# Patient Record
Sex: Female | Born: 1995 | Race: White | Hispanic: No | Marital: Single | Smoking: Never smoker
Health system: Southern US, Community
[De-identification: ages and names within clinical notes are randomized; demographics above are authoritative.]

## PROBLEM LIST (undated history)

## (undated) DIAGNOSIS — M2550 Pain in unspecified joint: Secondary | ICD-10-CM

## (undated) DIAGNOSIS — M549 Dorsalgia, unspecified: Secondary | ICD-10-CM

## (undated) DIAGNOSIS — R7303 Prediabetes: Secondary | ICD-10-CM

## (undated) DIAGNOSIS — E119 Type 2 diabetes mellitus without complications: Secondary | ICD-10-CM

## (undated) DIAGNOSIS — E559 Vitamin D deficiency, unspecified: Secondary | ICD-10-CM

## (undated) DIAGNOSIS — E663 Overweight: Secondary | ICD-10-CM

## (undated) DIAGNOSIS — N912 Amenorrhea, unspecified: Secondary | ICD-10-CM

## (undated) HISTORY — DX: Pain in unspecified joint: M25.50

## (undated) HISTORY — DX: Prediabetes: R73.03

## (undated) HISTORY — PX: WISDOM TOOTH EXTRACTION: SHX21

## (undated) HISTORY — DX: Overweight: E66.3

## (undated) HISTORY — DX: Vitamin D deficiency, unspecified: E55.9

## (undated) HISTORY — DX: Dorsalgia, unspecified: M54.9

## (undated) HISTORY — DX: Type 2 diabetes mellitus without complications: E11.9

## (undated) HISTORY — DX: Amenorrhea, unspecified: N91.2

---

## 2015-09-17 ENCOUNTER — Encounter (HOSPITAL_COMMUNITY): Payer: Self-pay

## 2015-09-17 ENCOUNTER — Ambulatory Visit (HOSPITAL_COMMUNITY)
Admission: EM | Admit: 2015-09-17 | Discharge: 2015-09-17 | Disposition: A | Discharge status: Home / Self Care | Payer: BLUE CROSS/BLUE SHIELD | Attending: Emergency Medicine | Admitting: Emergency Medicine

## 2015-09-17 DIAGNOSIS — R55 Syncope and collapse: Secondary | ICD-10-CM | POA: Diagnosis not present

## 2015-09-17 LAB — POCT I-STAT, CHEM 8
BUN: 14 mg/dL (ref 6–20)
CALCIUM ION: 1.19 mmol/L (ref 1.12–1.23)
Chloride: 101 mmol/L (ref 101–111)
Creatinine, Ser: 0.7 mg/dL (ref 0.44–1.00)
GLUCOSE: 120 mg/dL — AB (ref 65–99)
HCT: 45 % (ref 36.0–46.0)
HEMOGLOBIN: 15.3 g/dL — AB (ref 12.0–15.0)
Potassium: 3.9 mmol/L (ref 3.5–5.1)
Sodium: 140 mmol/L (ref 135–145)
TCO2: 27 mmol/L (ref 0–100)

## 2015-09-17 NOTE — ED Notes (Signed)
Patient states she fainted in class in today and she hit her head on the wall and right shoulder, Patient states she was really hot and everything was shrinking and she knew she was gonna fall out, patient's menstrual cycle began today and she thinks it may connected. No acute distress

## 2015-09-17 NOTE — Discharge Instructions (Signed)
Your EKG and blood work are normal today. You likely had a vasovagal episode. These are not uncommon in young women. Make sure you are eating regular meals and drinking plenty of fluids. If you are going to be standing for prolonged periods, make sure you are walking around or marching in place every 5-10 minutes. It would be a good idea to stay with your parents this weekend, just in case. If this becomes a recurring problem, please to the emergency room.

## 2015-09-17 NOTE — ED Provider Notes (Signed)
CSN: [id number]     Arrival date & time 09/17/15  1931 History   First MD Initiated Contact with Patient 09/17/15 1952     Chief Complaint  Patient presents with  . Near Syncope   (Consider location/radiation/quality/duration/timing/severity/associated sxs/prior Treatment) HPI  She is a 20 year old woman here with her dad for evaluation of syncopal episode. This afternoon during an art class, she had a fainting spell. She states she was standing a few feet from a wall when she felt a little dizzy, some tightness in her chest, slightly sick to her stomach, and knew she was going to pass out. When she fell, her classmates report that she hit her head and shoulder against the wall. She was out for approximately 1 minute. No bowel or bladder incontinence. No seizure-like activity.  She reports having breakfast and lunch today. She has been drinking fluids. Her period did start today.  She currently denies any dizziness, chest pain, shortness of breath, or palpitations. No persistent nausea or vomiting. She does report some tenderness of the right cheekbone and right temple as well as her right shoulder. No change in her vision.  History reviewed. No pertinent past medical history. History reviewed. No pertinent past surgical history. No family history on file. Social History  Substance Use Topics  . Smoking status: Never Smoker   . Smokeless tobacco: Never Used  . Alcohol Use: No   OB History    No data available     Review of Systems As in history of present illness Allergies  Review of patient's allergies indicates no known allergies.  Home Medications   Prior to Admission medications   Not on File   Meds Ordered and Administered this Visit  Medications - No data to display  BP 114/67 mmHg  Pulse 86  Temp(Src) 98.3 F (36.8 C) (Oral)  SpO2 99%  LMP 09/17/2015 (Exact Date) No data found.   Physical Exam  Constitutional: She is oriented to person, place, and time. She  appears well-developed and well-nourished. No distress.  HENT:  Mouth/Throat: Oropharynx is clear and moist. No oropharyngeal exudate.  Tender along the right cheekbone. Less so the right temple.  Eyes: EOM are normal. Pupils are equal, round, and reactive to light.  Neck: Neck supple.  Cardiovascular: Normal rate, regular rhythm and normal heart sounds.   No murmur heard. Pulmonary/Chest: Effort normal and breath sounds normal. No respiratory distress. She has no wheezes. She has no rales.  Musculoskeletal:  Right shoulder: Full active range of motion. Slight tenderness at the lateral edge of the acromion.  Neurological: She is alert and oriented to person, place, and time. No cranial nerve deficit. She exhibits normal muscle tone.    ED Course  Procedures (including critical care time) ED ECG REPORT   Date: 09/17/2015  Rate: 80  Rhythm: normal sinus rhythm  QRS Axis: normal  Intervals: normal  ST/T Wave abnormalities: normal  Conduction Disutrbances:none  Narrative Interpretation: NSR, normal ekg  Old EKG Reviewed: none available  I have personally reviewed the EKG tracing and agree with the computerized printout as noted.  Labs Review Labs Reviewed  POCT I-STAT, CHEM 8 - Abnormal; Notable for the following:    Glucose, Bld 120 (*)    Hemoglobin 15.3 (*)    All other components within normal limits    Imaging Review No results found.   MDM   1. Vaso vagal episode    EKG an i-STAT are normal. She describes a vasovagal episode. Provided  reassurance. Discussed that if this becomes a recurring thing, she will need to be seen in the emergency room.    Charm Rings, MD 09/17/15 2052

## 2017-03-24 ENCOUNTER — Encounter: Payer: Self-pay | Admitting: Emergency Medicine

## 2017-03-24 ENCOUNTER — Emergency Department
Admission: EM | Admit: 2017-03-24 | Discharge: 2017-03-24 | Disposition: A | Discharge status: Home / Self Care | Payer: BLUE CROSS/BLUE SHIELD | Source: Home / Self Care | Attending: Family Medicine | Admitting: Family Medicine

## 2017-03-24 ENCOUNTER — Emergency Department (INDEPENDENT_AMBULATORY_CARE_PROVIDER_SITE_OTHER): Payer: BLUE CROSS/BLUE SHIELD

## 2017-03-24 DIAGNOSIS — M7521 Bicipital tendinitis, right shoulder: Secondary | ICD-10-CM

## 2017-03-24 DIAGNOSIS — M25511 Pain in right shoulder: Secondary | ICD-10-CM

## 2017-03-24 DIAGNOSIS — M7551 Bursitis of right shoulder: Secondary | ICD-10-CM

## 2017-03-24 DIAGNOSIS — M542 Cervicalgia: Secondary | ICD-10-CM | POA: Diagnosis not present

## 2017-03-24 MED ORDER — PREDNISONE 20 MG PO TABS: ORAL_TABLET | ORAL | 0 refills | Status: DC

## 2017-03-24 MED ORDER — HYDROCODONE-ACETAMINOPHEN 5-325 MG PO TABS: 1.0000 | ORAL_TABLET | Freq: Four times a day (QID) | ORAL | 0 refills | Status: DC | PRN

## 2017-03-24 NOTE — ED Triage Notes (Signed)
Patient states that she has been having right shoulder and rotator cuff pain for about 3-4 days.  No apparent injury, limited use of shoulder, decrease sleep.

## 2017-03-24 NOTE — Discharge Instructions (Signed)
Apply ice pack for 20 to 30 minutes, 3 to 4 times daily  Continue until pain and swelling decrease.  Begin exercises as tolerated.  After finishing prednisone, may begin Aleve, 2 tabs every 12 hours as needed.

## 2017-03-24 NOTE — ED Provider Notes (Signed)
[facility]    CSN: [id number] Arrival date & time: 03/24/17  1122     History   Chief Complaint Chief Complaint  Patient presents with  . Shoulder Pain    HPI Jamie Hughes is a 21 y.o. female.   Patient complains of 3 to 4 day history of pain in her right shoulder and neck, and is concerned that she may have a rotator cuff injury.  However, she denies recent injury or change in activities.  No distal paresthesias or loss of strength.   The history is provided by the patient.  Shoulder Pain  Location:  Shoulder Shoulder location:  R shoulder Injury: no   Pain details:    Quality:  Aching   Radiates to: neck.   Severity:  Moderate   Onset quality:  Gradual   Duration:  4 days   Timing:  Constant   Progression:  Worsening Prior injury to area:  No Relieved by:  Nothing Worsened by:  Movement Ineffective treatments:  Heat Associated symptoms: decreased range of motion, neck pain, stiffness and tingling   Associated symptoms: no back pain, no fatigue, no fever, no muscle weakness, no numbness and no swelling     History reviewed. No pertinent past medical history.  There are no active problems to display for this patient.   Past Surgical History:  Procedure Laterality Date  . WISDOM TOOTH EXTRACTION      OB History    No data available       Home Medications    Prior to Admission medications   Medication Sig Start Date End Date Taking? Authorizing Provider  naproxen sodium (ALEVE) 220 MG tablet Take 220 mg by mouth.   Yes [provider]  HYDROcodone-acetaminophen (NORCO/VICODIN) 5-325 MG tablet Take 1 tablet every 6 (six) hours as needed by mouth for moderate pain. 03/24/17   Lattie HawBeese, Bhumi Godbey A, MD  predniSONE (DELTASONE) 20 MG tablet Take one tab by mouth twice daily for 4 days, then one daily. Take with food. 03/24/17   Lattie HawBeese, Larance Ratledge A, MD    Family History Family History  Problem Relation Age of Onset  . Diabetes Father      Social History Social History   Tobacco Use  . Smoking status: Never Smoker  . Smokeless tobacco: Never Used  Substance Use Topics  . Alcohol use: No  . Drug use: No     Allergies   Patient has no known allergies.   Review of Systems Review of Systems  Constitutional: Negative for fatigue and fever.  Musculoskeletal: Positive for neck pain and stiffness. Negative for back pain.  All other systems reviewed and are negative.    Physical Exam Triage Vital Signs ED Triage Vitals [03/24/17 1159]  Enc Vitals Group     BP (!) 132/91     Pulse Rate 64     Resp      Temp 98.3 F (36.8 C)     Temp Source Oral     SpO2 99 %     Weight 233 lb (105.7 kg)     Height 5\' 5"  (1.651 m)     Head Circumference      Peak Flow      Pain Score 7     Pain Loc      Pain Edu?      Excl. in GC?    No data found.  Updated Vital Signs BP (!) 132/91 (BP Location: Right Arm)   Pulse 64  Temp 98.3 F (36.8 C) (Oral)   Ht 5\' 5"  (1.651 m)   Wt 233 lb (105.7 kg)   LMP 01/20/2017   SpO2 99%   BMI 38.77 kg/m   Visual Acuity Right Eye Distance:   Left Eye Distance:   Bilateral Distance:    Right Eye Near:   Left Eye Near:    Bilateral Near:     Physical Exam  Constitutional: She appears well-developed and well-nourished. No distress.  HENT:  Head: Normocephalic.  Right Ear: External ear normal.  Left Ear: External ear normal.  Nose: Nose normal.  Mouth/Throat: Oropharynx is clear and moist.  Eyes: Conjunctivae are normal. Pupils are equal, round, and reactive to light.  Neck: Normal range of motion. Muscular tenderness present.    There is tenderness to palpation over the right trapezius muscle  as noted on diagram.   Cardiovascular: Normal heart sounds.  Pulmonary/Chest: Breath sounds normal.  Musculoskeletal:       Right shoulder: She exhibits decreased range of motion and tenderness. She exhibits no bony tenderness, no swelling, no crepitus, normal pulse and  normal strength.       Arms: Tenderness over right subacromial bursa. There is distinct tenderness to palpation over the insertion of the long head of the right biceps tendon.   Apley's, empty can tests negative.  Patient has slow but relatively good range of motion of the right shoulder.  Yergason's test positive.  Neurological: She is alert.  Skin: Skin is warm and dry. No rash noted.  Nursing note and vitals reviewed.    UC Treatments / Results  Labs (all labs ordered are listed, but only abnormal results are displayed) Labs Reviewed - No data to display  EKG  EKG Interpretation None       Radiology Dg Cervical Spine Complete  Result Date: 03/24/2017 CLINICAL DATA:  Cervicalgia EXAM: CERVICAL SPINE - COMPLETE 4+ VIEW COMPARISON:  None. FINDINGS: Frontal, lateral, open-mouth odontoid, and bilateral oblique views were obtained. There is no fracture or spondylolisthesis. Prevertebral soft tissues and predental space regions are normal. The disc spaces appear normal. There is no appreciable exit foraminal narrowing on the oblique views. IMPRESSION: No fracture or spondylolisthesis.  No evident arthropathic change. Electronically Signed   By: Bretta Bang III M.D.   On: 03/24/2017 13:15   Dg Shoulder Right  Result Date: 03/24/2017 CLINICAL DATA:  Right shoulder pain for 4 days. EXAM: RIGHT SHOULDER - 2+ VIEW COMPARISON:  None. FINDINGS: No acute fracture. No dislocation.  Unremarkable soft tissues. IMPRESSION: No acute bony pathology. Electronically Signed   By: Jolaine Click M.D.   On: 03/24/2017 13:14    Procedures Procedures (including critical care time)  Medications Ordered in UC Medications - No data to display   Initial Impression / Assessment and Plan / UC Course  I have reviewed the triage vital signs and the nursing notes.  Pertinent labs & imaging results that were available during my care of the patient were reviewed by me and considered in my medical  decision making (see chart for details).    Begin prednisone burst/taper.  Rx for Lortab. Controlled Substance Prescriptions I have consulted the [place] Controlled Substances Registry for this patient, and feel the risk/benefit ratio today is favorable for proceeding with this prescription for a controlled substance.  Apply ice pack for 20 to 30 minutes, 3 to 4 times daily  Continue until pain and swelling decrease.  Begin exercises as tolerated.  After finishing prednisone, may begin  Aleve, 2 tabs every 12 hours as needed. Followup with Dr. Rodney Langtonhomas Thekkekandam or Dr. Clementeen GrahamEvan Corey ([facility]) for further evaluation.    Final Clinical Impressions(s) / UC Diagnoses   Final diagnoses:  Acute shoulder bursitis, right  Biceps tendonitis on right    ED Discharge Orders        Ordered    predniSONE (DELTASONE) 20 MG tablet     03/24/17 1331    HYDROcodone-acetaminophen (NORCO/VICODIN) 5-325 MG tablet  Every 6 hours PRN     03/24/17 1331          Lattie HawBeese, Clarabelle Oscarson A, MD 03/29/17 1843

## 2018-05-15 DIAGNOSIS — N912 Amenorrhea, unspecified: Secondary | ICD-10-CM

## 2018-05-15 HISTORY — DX: Amenorrhea, unspecified: N91.2

## 2018-05-16 ENCOUNTER — Encounter: Payer: Self-pay | Admitting: Obstetrics & Gynecology

## 2018-06-10 ENCOUNTER — Other Ambulatory Visit (HOSPITAL_COMMUNITY)
Admission: RE | Admit: 2018-06-10 | Discharge: 2018-06-10 | Disposition: A | Discharge status: Home / Self Care | Payer: BC Managed Care – PPO | Source: Ambulatory Visit | Attending: Obstetrics & Gynecology | Admitting: Obstetrics & Gynecology

## 2018-06-10 ENCOUNTER — Encounter: Payer: Self-pay | Admitting: Obstetrics & Gynecology

## 2018-06-10 ENCOUNTER — Ambulatory Visit: Payer: BC Managed Care – PPO | Admitting: Obstetrics & Gynecology

## 2018-06-10 ENCOUNTER — Other Ambulatory Visit: Payer: Self-pay

## 2018-06-10 VITALS — BP 120/74 | HR 96 | Resp 16 | Ht 65.0 in | Wt 222.6 lb

## 2018-06-10 DIAGNOSIS — Z01419 Encounter for gynecological examination (general) (routine) without abnormal findings: Secondary | ICD-10-CM

## 2018-06-10 DIAGNOSIS — Z23 Encounter for immunization: Secondary | ICD-10-CM | POA: Diagnosis not present

## 2018-06-10 DIAGNOSIS — Z124 Encounter for screening for malignant neoplasm of cervix: Secondary | ICD-10-CM | POA: Diagnosis not present

## 2018-06-10 DIAGNOSIS — N912 Amenorrhea, unspecified: Secondary | ICD-10-CM

## 2018-06-10 DIAGNOSIS — L680 Hirsutism: Secondary | ICD-10-CM

## 2018-06-10 LAB — POCT URINE PREGNANCY: PREG TEST UR: NEGATIVE

## 2018-06-10 MED ORDER — MEDROXYPROGESTERONE ACETATE 10 MG PO TABS: ORAL_TABLET | ORAL | 0 refills | Status: DC

## 2018-06-10 NOTE — Progress Notes (Signed)
23 y.o. G0P0000 Single White or Caucasian female here for new patient annual exam.  Has not had a gynecological exam in several years.  Was last seen at [facility].  [place]ives in [place] now and works at [employer].  Has not been SA in the past.      About 2017, she started having issues with irregular cycles.  This seemed very stress related.  She hasn't had a cycle in about a year.  Last cycle 09/24/17 and this was very dark.  It lasted about five days and was not heavy although her typical cycle is 7 day and heavy throughout the cycle.    She does have increased facial hair so much that she needs to shave her chin.    Patient's last menstrual period was 09/24/2017 (exact date).          Sexually active: No.  The current method of family planning is abstinence.    Exercising: Yes.    walking Smoker:  no  Health Maintenance: Pap:  Never Gardasil: has done one vaccination on 11/15/10.  I was able to review the state shot records that she had on her phone TDaP:  2012 Screening Labs: PCP   reports that she has never smoked. She has never used smokeless tobacco. She reports current alcohol use. She reports that she does not use drugs.  Past Medical History:  Diagnosis Date  . Amenorrhea     Past Surgical History:  Procedure Laterality Date  . WISDOM TOOTH EXTRACTION      No current outpatient medications on file.   No current facility-administered medications for this visit.     Family History  Problem Relation Age of Onset  . Diabetes Father   . Breast cancer Maternal Aunt   . Diabetes Paternal Grandfather     Review of Systems  All other systems reviewed and are negative.   Exam:   BP 120/74 (BP Location: Right Arm, Patient Position: Sitting, Cuff Size: Large)   Pulse 96   Resp 16   Ht 5\' 5"  (1.651 m)   Wt 222 lb 9.6 oz (101 kg)   LMP 09/24/2017 (Exact Date)   BMI 37.04 kg/m      Height: 5\' 5"  (165.1 cm)  Ht Readings from Last 3 Encounters:  06/10/18 5\' 5"  (1.651 m)   03/24/17 5\' 5"  (1.651 m)    General appearance: alert, cooperative and appears stated age Head: Normocephalic, without obvious abnormality, atraumatic Neck: no adenopathy, supple, symmetrical, trachea midline and thyroid normal to inspection and palpation Lungs: clear to auscultation bilaterally Breasts: normal appearance, no masses or tenderness Heart: regular rate and rhythm Abdomen: soft, non-tender; bowel sounds normal; no masses,  no organomegaly Extremities: extremities normal, atraumatic, no cyanosis or edema Skin: Skin color, texture, turgor normal. No rashes or lesions Lymph nodes: Cervical, supraclavicular, and axillary nodes normal. No abnormal inguinal nodes palpated Neurologic: Grossly normal Ferriman-Gallwey Score:  15   Pelvic: External genitalia:  no lesions              Urethra:  normal appearing urethra with no masses, tenderness or lesions              Bartholins and Skenes: normal                 Vagina: normal appearing vagina with normal color and discharge, no lesions              Cervix: no lesions  Pap taken: Yes.   Bimanual Exam:  Uterus:  normal size, contour, position, consistency, mobility, non-tender              Adnexa: normal adnexa and no mass, fullness, tenderness               Rectovaginal: Confirms               Anus:  normal sphincter tone, no lesions  Chaperone was present for exam.  A:  Well Woman with normal exam Lapse of gyn care Amenorrhea Hirsutism Probable PCOS  P:   Mammogram guidelines reviewed.  Should start around age 50. pap smear obtained today Will plan to complete Gardisil vaccination.  #2 will be given today.   Provera 10mg  x 10 days given to start cycle. FSH, prolactin, TSH (neg UPT today) Total testosterone also ordered May need additional androgen testing and PUS  return annually or prn  In additional to routine GYN exam, additional time spent with pt on issues of amenorrhea and hirsutism including  possible causes, evaluation and treatment options.

## 2018-06-11 LAB — CYTOLOGY - PAP: DIAGNOSIS: NEGATIVE

## 2018-06-13 LAB — TESTOSTERONE, TOTAL, LC/MS/MS: Testosterone, total: 38.7 ng/dL (ref 10.0–55.0)

## 2018-06-13 LAB — TSH: TSH: 1.56 u[IU]/mL (ref 0.450–4.500)

## 2018-06-13 LAB — FOLLICLE STIMULATING HORMONE: FSH: 6.2 m[IU]/mL

## 2018-06-13 LAB — PROLACTIN: PROLACTIN: 8.7 ng/mL (ref 4.8–23.3)

## 2018-06-14 ENCOUNTER — Ambulatory Visit: Payer: BC Managed Care – PPO | Admitting: Family Medicine

## 2018-06-14 ENCOUNTER — Encounter: Payer: Self-pay | Admitting: Family Medicine

## 2018-06-14 ENCOUNTER — Telehealth: Payer: Self-pay | Admitting: Obstetrics & Gynecology

## 2018-06-14 VITALS — BP 104/80 | HR 87 | Temp 98.1°F | Ht 65.0 in | Wt 224.2 lb

## 2018-06-14 DIAGNOSIS — R6889 Other general symptoms and signs: Secondary | ICD-10-CM

## 2018-06-14 LAB — POCT INFLUENZA A/B
INFLUENZA A, POC: NEGATIVE
Influenza B, POC: NEGATIVE

## 2018-06-14 MED ORDER — BENZONATATE 200 MG PO CAPS: 200.0000 mg | ORAL_CAPSULE | Freq: Three times a day (TID) | ORAL | 0 refills | Status: DC | PRN

## 2018-06-14 MED ORDER — FLUTICASONE PROPIONATE 50 MCG/ACT NA SUSP: 2.0000 | Freq: Every day | NASAL | 6 refills | Status: DC

## 2018-06-14 NOTE — Telephone Encounter (Signed)
Spoke with patient and her father on conference call. Patient gives verbal authorization for her father to listen on the call.  He is also on the designated party release form.   Jamie Hughes states she left work yesterday 06/13/2018 and felt "feverish" and that she couldn't think straight, all over body aches and went to bed. Woke up in the middle of the night feeling nauseated but did not vomit. Also c/o sore throat, intermittent cough, and voice sounds hoarse.  Tried to take her temperature in the middle of the night but was not sure what the temperature read. States right now she feels like her head is "fuzzy" Temp this morning when first awake was 100.3. Has not tried any OTC medications for body aches or fevers.  States her left arm where she received gardasil vaccine on 06/10/18 feels "tense." No redness or swelling of arm. Able to move extremity without issue.   Father is driving [place] from [place] to pick her up and take her for care.  Advised urgent care or PCP is most appropriate.   Encouraged comfort measures, Tylenol and push fluids. Advised fever and body aches common reaction with Gardasil, however, would be a late reaction. Patient does not remember if she had a reaction to the vaccine that she received a few years go.   Pt eating and drinking well.  She will take tylenol now, confirmed with patient no problems to take with provera.   Office visit with Dr. Artis Flock at [facility] today at 1250 arrival. Instructions and building information given.   Father and patient made aware. Can establish PCP care.   Advised office visit if with Dr. Hyacinth Meeker for follow up. Declines Monday appointment due to work but accepts Tuesday appointment at 1600. Advised to call if feeling improved and does not need appointment with Dr. Hyacinth Meeker.   Okay as instructed?

## 2018-06-14 NOTE — Patient Instructions (Signed)
-  ibuprofen 600-800mg  up to three times a day for fever/body aches -cool mist humidifier at night -flonase at night -honey off the spoon daily  -I like robitussin DM during the day and nyquil at night. nyquil has tylenol in it so watch your dosing if using tylenol during the day.  -tessalon pearls prn for cough  Let us know if not getting better or worsening symptoms.  Nice to meet you!   Dr. Artis Flock

## 2018-06-14 NOTE — Telephone Encounter (Signed)
Agree with recommendations.  This is likely viral and not a Gardisil reaction due to being more than 72 hours after the vaccination.  Ok to close encounter.

## 2018-06-14 NOTE — Progress Notes (Signed)
Patient: Jamie Hughes MRN: [id number] DOB: 1995/06/25 PCP: Patient, No Pcp Per     Subjective:  Chief Complaint  Patient presents with  . Sore Throat  . Nausea  . Fever  . Cough    HPI: The patient is a 23 y.o. female who presents today for fever, cough and a sore throat. She had a garadsil shot on 1/27 this week. She was at work yesterday and started to feel hot and feverish. She felt fuzzy in the head and had a headache. She did not sleep well at all last night. Last night she took her temperature and thought she had a fever to 104 or 100.4. thermometer was cheap and then kept saying high. Today her fever has been 100.3. She took tylenol today at noon. She also is achy, congested and has a cough. She is unsure of sick contacts. She does not have hx of asthma, but this morning she was short of breath and felt like she couldn't breath. She does not smoke. She states she is coughing up mucous.   LMP: last may, but never had sex.   Review of Systems  Constitutional: Positive for chills, fatigue and fever.  HENT: Positive for congestion, ear pain, sinus pressure, sinus pain and sore throat.   Eyes: Negative for pain.  Respiratory: Positive for cough and shortness of breath.   Cardiovascular: Negative for chest pain.  Gastrointestinal: Positive for nausea. Negative for abdominal pain.  Musculoskeletal: Positive for back pain, myalgias and neck pain.  Neurological: Positive for headaches. Negative for dizziness.    Allergies Patient has No Known Allergies.  Past Medical History Patient  has a past medical history of Amenorrhea.  Surgical History Patient  has a past surgical history that includes Wisdom tooth extraction.  Family History Pateint's family history includes Breast cancer in her maternal aunt; Diabetes in her father and paternal grandfather.  Social History Patient  reports that she has never smoked. She has never used smokeless tobacco. She reports current  alcohol use. She reports that she does not use drugs.    Objective: Vitals:   06/14/18 1308  BP: 104/80  Pulse: 87  Temp: 98.1 F (36.7 C)  TempSrc: Oral  SpO2: 98%  Weight: 224 lb 3.2 oz (101.7 kg)  Height: 5\' 5"  (1.651 m)    Body mass index is 37.31 kg/m.  Physical Exam Constitutional:      Appearance: She is obese.  HENT:     Right Ear: Tympanic membrane and ear canal normal.     Left Ear: Tympanic membrane and ear canal normal.     Nose: No congestion.     Comments: Normal nasal turbinates.  No TTP over sinuses     Mouth/Throat:     Mouth: Mucous membranes are moist.     Pharynx: Posterior oropharyngeal erythema present. No oropharyngeal exudate.  Eyes:     Conjunctiva/sclera: Conjunctivae normal.  Neck:     Musculoskeletal: Normal range of motion and neck supple.  Cardiovascular:     Rate and Rhythm: Normal rate and regular rhythm.     Heart sounds: No murmur.  Pulmonary:     Effort: Pulmonary effort is normal. No respiratory distress.     Breath sounds: Normal breath sounds. No wheezing or rales.  Abdominal:     General: Bowel sounds are normal.     Palpations: Abdomen is soft.  Lymphadenopathy:     Cervical: No cervical adenopathy.  Skin:    General: Skin is warm.  Capillary Refill: Capillary refill takes less than 2 seconds.  Neurological:     Mental Status: She is alert.    Flu: negative     Assessment/plan: 1. Flu-like symptoms Exam reassuring with no abnormalities. Doubtful immunization reaction since > 72 hours. Declines tamiflu. Conservative therapy with rest, fluids, ibuprofen prn for fever/pain or tylenol, otc cough medication, honey and cool mist humidifier. Also want her to start flonase at night. Discussed viral illness can take 7 days to resolve and can run fevers with this. No signs of infection and no indication for anabiotics  at this point. Let us know if not feeling better or worsening symptoms.  - POCT Influenza A/B    Return if  symptoms worsen or fail to improve.   Orland MustardAllison Iolanda Folson, MD [facility]   06/14/2018

## 2018-06-14 NOTE — Telephone Encounter (Signed)
Patient's dad, Theron Arista, calling regarding daughter. States she was in on 06/10/18 and received gardasil. Her arm is sore and she is having flu-like symptoms. Unsure whether this could be from the shot or something else. States her temperature was too high to register on the thermometer yesterday. It is down to 100.3 today. Dad would like advice on where to take her.

## 2018-06-17 ENCOUNTER — Telehealth: Payer: Self-pay | Admitting: Obstetrics & Gynecology

## 2018-06-17 NOTE — Telephone Encounter (Signed)
Patient called and cancelled her appointment on 06/18/18 with Dr. Hyacinth Meeker to follow up re: HPV vaccine. She said she is doing better and will call back if needed. She also said the swelling has gone down and the muscle is just a little sore now.

## 2018-06-18 ENCOUNTER — Ambulatory Visit: Payer: Self-pay | Admitting: Obstetrics & Gynecology

## 2018-06-27 ENCOUNTER — Ambulatory Visit: Payer: BC Managed Care – PPO | Admitting: Family Medicine

## 2018-07-10 ENCOUNTER — Telehealth: Payer: Self-pay | Admitting: Obstetrics & Gynecology

## 2018-07-10 ENCOUNTER — Encounter: Payer: Self-pay | Admitting: Family Medicine

## 2018-07-10 ENCOUNTER — Ambulatory Visit (INDEPENDENT_AMBULATORY_CARE_PROVIDER_SITE_OTHER): Payer: BC Managed Care – PPO | Admitting: Family Medicine

## 2018-07-10 VITALS — BP 116/62 | HR 87 | Temp 98.1°F | Ht 65.0 in | Wt 220.2 lb

## 2018-07-10 DIAGNOSIS — L680 Hirsutism: Secondary | ICD-10-CM

## 2018-07-10 DIAGNOSIS — Z0000 Encounter for general adult medical examination without abnormal findings: Secondary | ICD-10-CM | POA: Diagnosis not present

## 2018-07-10 DIAGNOSIS — N926 Irregular menstruation, unspecified: Secondary | ICD-10-CM

## 2018-07-10 LAB — CBC WITH DIFFERENTIAL/PLATELET
BASOS ABS: 0 10*3/uL (ref 0.0–0.1)
Basophils Relative: 0.6 % (ref 0.0–3.0)
EOS ABS: 0.1 10*3/uL (ref 0.0–0.7)
Eosinophils Relative: 1.7 % (ref 0.0–5.0)
HEMATOCRIT: 38.6 % (ref 36.0–46.0)
Hemoglobin: 13.3 g/dL (ref 12.0–15.0)
LYMPHS PCT: 35.7 % (ref 12.0–46.0)
Lymphs Abs: 2.9 10*3/uL (ref 0.7–4.0)
MCHC: 34.6 g/dL (ref 30.0–36.0)
MCV: 84.2 fl (ref 78.0–100.0)
Monocytes Absolute: 0.5 10*3/uL (ref 0.1–1.0)
Monocytes Relative: 5.9 % (ref 3.0–12.0)
Neutro Abs: 4.5 10*3/uL (ref 1.4–7.7)
Neutrophils Relative %: 56.1 % (ref 43.0–77.0)
Platelets: 291 10*3/uL (ref 150.0–400.0)
RBC: 4.58 Mil/uL (ref 3.87–5.11)
RDW: 13.3 % (ref 11.5–15.5)
WBC: 8 10*3/uL (ref 4.0–10.5)

## 2018-07-10 LAB — LIPID PANEL
CHOL/HDL RATIO: 5
Cholesterol: 158 mg/dL (ref 0–200)
HDL: 29.4 mg/dL — AB (ref >39.00)
LDL Cholesterol: 89 mg/dL (ref 0–99)
NonHDL: 128.45
Triglycerides: 198 mg/dL — ABNORMAL HIGH (ref 0.0–149.0)
VLDL: 39.6 mg/dL (ref 0.0–40.0)

## 2018-07-10 LAB — COMPREHENSIVE METABOLIC PANEL
ALK PHOS: 80 U/L (ref 39–117)
ALT: 13 U/L (ref 0–35)
AST: 12 U/L (ref 0–37)
Albumin: 4.1 g/dL (ref 3.5–5.2)
BILIRUBIN TOTAL: 0.4 mg/dL (ref 0.2–1.2)
BUN: 14 mg/dL (ref 6–23)
CO2: 26 mEq/L (ref 19–32)
CREATININE: 0.76 mg/dL (ref 0.40–1.20)
Calcium: 9.1 mg/dL (ref 8.4–10.5)
Chloride: 102 mEq/L (ref 96–112)
GFR: 94.07 mL/min (ref >60.00)
GLUCOSE: 107 mg/dL — AB (ref 70–99)
Potassium: 3.9 mEq/L (ref 3.5–5.1)
Sodium: 136 mEq/L (ref 135–145)
TOTAL PROTEIN: 7.7 g/dL (ref 6.0–8.3)

## 2018-07-10 NOTE — Progress Notes (Signed)
Patient: Jamie Hughes MRN: [id number] DOB: 18-Jun-1995 PCP: Orma Flaming, MD     Subjective:  Chief Complaint  Patient presents with  . Establish Care  . Annual Exam    HPI: The patient is a 23 y.o. female who presents today for annual exam. She denies any changes to past medical history. There have been no recent hospitalizations. They are not following a well balanced diet and exercise plan. Weight has been stable. No complaints today. She has been eating healthier and starting to work out. She has lost 10 pounds.   She has FH of diabetes in her dad and her paternal grandfather. Maternal aunt with breast cancer. Negative BRCA testing.   utd on her pap smear/tdap. Just had her hpv vaccine. Never sexually active. No hiv needed at this time.   Immunization History  Administered Date(s) Administered  . DTaP 09/15/2010  . HPV 9-valent 06/10/2018  . Hpv 11/15/2010  . Varicella 06/30/1996, 09/15/2010    Mammogram: routine at age 61.  Pap smear: 06/10/2018   Review of Systems  Constitutional: Negative for chills, fatigue and fever.  HENT: Negative.  Negative for dental problem, ear pain, hearing loss and trouble swallowing.   Eyes: Negative for visual disturbance.  Respiratory: Negative for cough, chest tightness and shortness of breath.   Cardiovascular: Negative for chest pain, palpitations and leg swelling.  Gastrointestinal: Negative for abdominal pain, blood in stool, diarrhea and nausea.  Endocrine: Negative for cold intolerance, polydipsia, polyphagia and polyuria.  Genitourinary: Positive for menstrual problem. Negative for dysuria and hematuria.       Currently being treated for this by gyn-Dr. Hale Bogus.  Musculoskeletal: Positive for back pain. Negative for arthralgias and neck pain.  Skin: Negative for rash.  Neurological: Negative for dizziness and headaches.  Psychiatric/Behavioral: Negative for dysphoric mood and sleep disturbance. The patient is not  nervous/anxious.     Allergies Patient has No Known Allergies.  Past Medical History Patient  has a past medical history of Amenorrhea.  Surgical History Patient  has a past surgical history that includes Wisdom tooth extraction.  Family History Pateint's family history includes Breast cancer in her maternal aunt; Diabetes in her father, paternal grandfather, and paternal grandmother; Miscarriages / [place] in her mother.  Social History Patient  reports that she has never smoked. She has never used smokeless tobacco. She reports current alcohol use. She reports that she does not use drugs.    Objective: Vitals:   07/10/18 1421  BP: 116/62  Pulse: 87  Temp: 98.1 F (36.7 C)  TempSrc: Oral  SpO2: 97%  Weight: 220 lb 3.2 oz (99.9 kg)  Height: '5\' 5"'  (1.651 m)    Body mass index is 36.64 kg/m.  Physical Exam Vitals signs reviewed.  Constitutional:      Appearance: She is well-developed.  HENT:     Right Ear: External ear normal.     Left Ear: External ear normal.  Eyes:     Conjunctiva/sclera: Conjunctivae normal.     Pupils: Pupils are equal, round, and reactive to light.  Neck:     Musculoskeletal: Normal range of motion and neck supple.     Thyroid: No thyromegaly.  Cardiovascular:     Rate and Rhythm: Normal rate and regular rhythm.     Heart sounds: Normal heart sounds. No murmur.  Pulmonary:     Effort: Pulmonary effort is normal.     Breath sounds: Normal breath sounds.  Abdominal:     General: Bowel sounds  are normal. There is no distension.     Palpations: Abdomen is soft.     Tenderness: There is no abdominal tenderness.  Lymphadenopathy:     Cervical: No cervical adenopathy.  Skin:    General: Skin is warm and dry.     Findings: No rash.  Neurological:     Mental Status: She is alert and oriented to person, place, and time.     Cranial Nerves: No cranial nerve deficit.     Coordination: Coordination normal.     Deep Tendon Reflexes:  Reflexes normal.  Psychiatric:        Behavior: Behavior normal.        Depression screen [facility] 2/9 07/10/2018  Decreased Interest 0  Down, Depressed, Hopeless 0  PHQ - 2 Score 0     Assessment/plan: 1. Annual physical exam Routine labs that were not done at the gyn. utd on her shots, pap, etc. Declines flu today. Trying to lose weight and has lost 10 pounds. Irregular periods being treated by Dr. Sabra Heck. Needs to see dentist q 6 months. Exercise at least 5x/week. F/u in one year or as needed.  Patient counseling '[x]'    Nutrition: Stressed importance of moderation in sodium/caffeine intake, saturated fat and cholesterol, caloric balance, sufficient intake of fresh fruits, vegetables, fiber, calcium, iron, and 1 mg of folate supplement per day (for females capable of pregnancy).  '[x]'    Stressed the importance of regular exercise.   '[x]'    Substance Abuse: Discussed cessation/primary prevention of tobacco, alcohol, or other drug use; driving or other dangerous activities under the influence; availability of treatment for abuse.   '[x]'    Injury prevention: Discussed safety belts, safety helmets, smoke detector, smoking near bedding or upholstery.   '[x]'    Sexuality: Discussed sexually transmitted diseases, partner selection, use of condoms, avoidance of unintended pregnancy  and contraceptive alternatives.  '[x]'    Dental health: Discussed importance of regular tooth brushing, flossing, and dental visits.  '[x]'    Health maintenance and immunizations reviewed. Please refer to Health maintenance section.    - CBC with Differential/Platelet - Comprehensive metabolic panel - Lipid panel - VITAMIN D 25 Hydroxy (Vit-D Deficiency, Fractures)     Return in about 1 year (around 07/11/2019).     Orma Flaming, MD [place]  07/10/2018

## 2018-07-10 NOTE — Telephone Encounter (Signed)
Patient is calling to report that she has started her cycle.

## 2018-07-10 NOTE — Telephone Encounter (Signed)
Spoke with patient. Started menses on 06/28/18 after taking provera 10 mg daily for 10 days. Patient seen in office on 06/10/18 for AEX. Labs normal, hx of irregular cycles.  Advised patient to continue to monitor and calendar menses, return call to office if no menses in 3 months or irregular menses continues.  Will update Dr. Hyacinth Meeker and return call with any additional recommendations.   Routing to Dr. Hyacinth Meeker -any additional f/u needed?

## 2018-07-10 NOTE — Patient Instructions (Signed)
You're doing great. Keep up the weight loss and healthy diet changes Routine lab work today Up to date on everything  Good for one year or unless you get sick! So good to see you!  Dr. Artis Flock

## 2018-07-11 ENCOUNTER — Encounter: Payer: Self-pay | Admitting: Family Medicine

## 2018-07-11 ENCOUNTER — Other Ambulatory Visit: Payer: Self-pay | Admitting: Family Medicine

## 2018-07-11 DIAGNOSIS — E559 Vitamin D deficiency, unspecified: Secondary | ICD-10-CM | POA: Insufficient documentation

## 2018-07-11 LAB — VITAMIN D 25 HYDROXY (VIT D DEFICIENCY, FRACTURES): VITD: 19.17 ng/mL — ABNORMAL LOW (ref 30.00–100.00)

## 2018-07-11 MED ORDER — VITAMIN D (ERGOCALCIFEROL) 1.25 MG (50000 UNIT) PO CAPS
ORAL_CAPSULE | ORAL | 0 refills | Status: DC
Start: 2018-07-11 — End: 2018-11-05

## 2018-07-15 MED ORDER — NORETHIN ACE-ETH ESTRAD-FE 1-20 MG-MCG PO TABS: 1.0000 | ORAL_TABLET | Freq: Every day | ORAL | 0 refills | Status: DC

## 2018-07-15 NOTE — Telephone Encounter (Signed)
Spoke with patient regarding benefit for recommended ultrasound. Patient understood and agreeable. Patient ready to schedule. Patient scheduled 07/25/2018 with Dr Hyacinth Meeker. Patient  Declined earlier appointments, due to her schedule. Patient is aware of appointment date, arrival time and cancellation policy. Patient had no further questions.  Forwarding to Dr Hyacinth Meeker for review.   cc: Carmelina Dane, RN

## 2018-07-15 NOTE — Telephone Encounter (Signed)
I would like for her to have an ultrasound as her testosterone level was normal and she does have irregular cycles and significant facial hair that seems like PCOS but the lab work did not confirm it.  Also, it is ok to start combination OCP now for cycle regulation.  I would start with loestrin 1/20 FE.  Take one tab daily.  #64month/3RF.

## 2018-07-15 NOTE — Telephone Encounter (Signed)
Encounter closed

## 2018-07-15 NOTE — Telephone Encounter (Signed)
Spoke with patient, advised as seen below per Dr. Hyacinth Meeker.   1. Rx for Loestrin Fe to pharmacy on file.  2. Patient request to review her out of pocket cost for PUS for proceeding with scheduling. Order placed for precert, advised business office will return call to review benefits and assist with scheduling.   Patient verbalizes understanding and is agreeable.  Routing to [employer] and [employer]

## 2018-07-22 ENCOUNTER — Telehealth: Payer: Self-pay | Admitting: Obstetrics & Gynecology

## 2018-07-22 NOTE — Telephone Encounter (Signed)
Patient would like to discuss her benefits for her upcoming PUS 07/25/18. Patient is aware Thomasene Lot and Clotilde Dieter are out of the office today.

## 2018-07-23 NOTE — Telephone Encounter (Signed)
Spoke with patient, see previous phone note on 07/15/2018. Reviewed benefits a second time, and address all questions regarding benefits. Patient is agreeable with information presented.   Will close encounter

## 2018-07-25 ENCOUNTER — Other Ambulatory Visit: Payer: Self-pay | Admitting: Obstetrics & Gynecology

## 2018-07-25 ENCOUNTER — Ambulatory Visit: Payer: BC Managed Care – PPO

## 2018-07-25 ENCOUNTER — Other Ambulatory Visit: Payer: Self-pay

## 2018-07-25 ENCOUNTER — Ambulatory Visit (INDEPENDENT_AMBULATORY_CARE_PROVIDER_SITE_OTHER): Payer: BC Managed Care – PPO | Admitting: Obstetrics & Gynecology

## 2018-07-25 VITALS — BP 132/90 | HR 88 | Resp 16 | Ht 65.0 in | Wt 228.0 lb

## 2018-07-25 DIAGNOSIS — N926 Irregular menstruation, unspecified: Secondary | ICD-10-CM

## 2018-07-25 DIAGNOSIS — N912 Amenorrhea, unspecified: Secondary | ICD-10-CM

## 2018-07-25 DIAGNOSIS — L680 Hirsutism: Secondary | ICD-10-CM

## 2018-07-25 NOTE — Progress Notes (Signed)
23 y.o. G0P0000 Single White or Caucasian female here for pelvic ultrasound due to amenorrhea.  Pt did have cycle after provera challenge and evaluation with TSH, FSH and prolactin was normal.  She is planning on starting OCPs after visit today unless ultrasound shows significant findings.  She has not been SA so transabdominal ultrasound planned today.  Patient's last menstrual period was 06/30/2018 (exact date).  Contraception: not SA  Findings:  UTERUS: 6 x 34.0 x 2.5cm EMS: 6.66mm  ADNEXA: Left ovary: 3.1 x 2.1 x 2.4cm       Right ovary:  3.3 x 2.0 x 2.9cm CUL DE SAC: no free fluid  Discussion:  Findings discussed.  No clear evidence of PCOS noted on ultrasound today.  D/w pt additional evalutaion with blood work but she does not want to proceed with that today and I am ok with this.  For now, she will start her pills and give me an update after her first cycle.  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  Assessment:  Amenorrhea with + response with provera challenge Finishing Gardisil vaccination  Plan:  Will start OCPs.  Has rx.   Will return for 3rd Gardisil when has follow up.  ~15 minutes spent with patient >50% of time was in face to face discussion of above.

## 2018-07-28 ENCOUNTER — Encounter: Payer: Self-pay | Admitting: Obstetrics & Gynecology

## 2018-07-31 ENCOUNTER — Encounter: Payer: Self-pay | Admitting: Family Medicine

## 2018-07-31 ENCOUNTER — Telehealth: Payer: Self-pay | Admitting: Family Medicine

## 2018-07-31 DIAGNOSIS — R7303 Prediabetes: Secondary | ICD-10-CM

## 2018-07-31 NOTE — Telephone Encounter (Signed)
Spoke to patient and advised of notes per Dr. Artis Flock.  Pt verbalized understanding and lab appt scheduled for 3/20.

## 2018-07-31 NOTE — Telephone Encounter (Signed)
Please let patient know I got her records from past GYN visit. It looks like in 2016 her A1c for diabetes was elevated at 6.3. her sugar was a little high on lab work. I didn't check this, but we need to make sure does not have pre diabetes or even full blown diabetes. Can she come back in for lab work only?   Again, really work on cardio exercise to help sugar and cholesterol numbers. I put in future order for lab for her. Does not need to be fasting.   Dr. Artis Flock

## 2018-08-02 ENCOUNTER — Telehealth: Payer: Self-pay | Admitting: Obstetrics & Gynecology

## 2018-08-02 ENCOUNTER — Telehealth: Payer: Self-pay | Admitting: Family Medicine

## 2018-08-02 NOTE — Telephone Encounter (Signed)
Patient has questions about irregular periods.

## 2018-08-02 NOTE — Telephone Encounter (Signed)
See note

## 2018-08-02 NOTE — Telephone Encounter (Signed)
Called and left detailed voicemail message on patient's cell phone.  Will need appt to be seen.  Explained to patient on message that we are in the process of setting up webex visits.  Advised patient that we will call her on Monday 3/23 to schedule appt.

## 2018-08-02 NOTE — Telephone Encounter (Signed)
Message left to return call to Lonetta Blassingame at [phone].    

## 2018-08-02 NOTE — Telephone Encounter (Signed)
Copied from CRM [id number]. Topic: Quick Communication - See Telephone Encounter >> Aug 02, 2018  1:39 PM Jens Som A wrote: CRM for notification. See Telephone encounter for: 08/02/18.  Patient is calling to discuss some stressful items with Dr. Artis Flock. Patient declined discussing item with [employer] agent. Wanted to discuss how stress can affect her health. [phone] (M) Telephone number and email address verified.

## 2018-08-06 ENCOUNTER — Other Ambulatory Visit: Payer: BC Managed Care – PPO

## 2018-08-06 NOTE — Telephone Encounter (Signed)
Patient returning call.

## 2018-08-06 NOTE — Telephone Encounter (Signed)
Spoke with patient.   1. LMP 08/06/18. Started menses without provera, will start new OCP with this menses.   2. States she has discussed with Dr. Hyacinth Meeker menses have likely stopped in the past due to increased stress. Denies anxiety or depression. Patient requesting a letter for her landlord for emotional support animal. Reports due to Covid-19 she lives alone, is working from home, increased hours and increased stress. Feels she is confined and would benefits from having her cat. Patient is unclear where the letter will need to go, will advise upon return call if Dr. Hyacinth Meeker is agreeable. Advised Dr. Hyacinth Meeker will review, our office will notify of recommendations.   Dr. Hyacinth Meeker -please advise on letter.

## 2018-08-07 ENCOUNTER — Encounter: Payer: Self-pay | Admitting: Obstetrics & Gynecology

## 2018-08-07 NOTE — Telephone Encounter (Signed)
Letter has been written for her.  We can print, sign and fax tomorrow if she knows where it needs to go.  Otherwise, we can leave for her to pick up or just sent in the mail.  Thanks.

## 2018-08-07 NOTE — Telephone Encounter (Signed)
Call to patient. Requests letter be faxed to [phone]. She will forward letter to landlord and we should expect phone call to confirm this was written by Dr Hyacinth Meeker. Patient gives verbal consent to confirm this information. Advised will fax letter as requested.   Routing to [employer], Multimedia programmer.

## 2018-08-07 NOTE — Telephone Encounter (Signed)
Letter faxed as requested

## 2018-08-08 NOTE — Telephone Encounter (Signed)
Patient called and said she'll be emailing a form to our web site that needs signed by Dr. Hyacinth Meeker. The form is needed to help her get an emotional support cat. She'd like the form faxed to the number below when complete.  Fax: [phone]

## 2018-08-21 ENCOUNTER — Encounter: Payer: Self-pay | Admitting: Obstetrics & Gynecology

## 2018-08-22 NOTE — Progress Notes (Signed)
Erroneous encounter

## 2018-10-04 ENCOUNTER — Telehealth: Payer: Self-pay | Admitting: Obstetrics & Gynecology

## 2018-10-04 NOTE — Telephone Encounter (Signed)
Spoke with patient. Advised gardasil vaccine recommended at 0, 2, 6 months. Addressed concerns regarding OV during Covid-19, questions answered. Patient will also need 3 mo OCP f/u, she is requesting to combine OV with 3rd gardasil, needs 4pm appt. OV scheduled for 6/23 at 4pm with Dr. Hyacinth Meeker. Patient verbalizes understanding.   Routing to provider for final review. Patient is agreeable to disposition. Will close encounter.

## 2018-10-04 NOTE — Telephone Encounter (Signed)
Patient has her Gardasil appointment 10/08/18. I called her to reschedule to a Thursday when we will have a nurse schedule. She is worried about coming into the office and asked how long she can wait to have her Gardasil? She is working and asked if a detailed message could be left on her voicemail?

## 2018-10-09 ENCOUNTER — Ambulatory Visit: Payer: BC Managed Care – PPO

## 2018-10-24 ENCOUNTER — Ambulatory Visit: Payer: Self-pay

## 2018-11-05 ENCOUNTER — Encounter: Payer: Self-pay | Admitting: Obstetrics & Gynecology

## 2018-11-05 ENCOUNTER — Ambulatory Visit: Payer: BC Managed Care – PPO | Admitting: Obstetrics & Gynecology

## 2018-11-05 ENCOUNTER — Other Ambulatory Visit: Payer: Self-pay

## 2018-11-05 VITALS — BP 122/94 | HR 92 | Temp 97.7°F | Ht 65.0 in | Wt 234.0 lb

## 2018-11-05 DIAGNOSIS — N926 Irregular menstruation, unspecified: Secondary | ICD-10-CM | POA: Diagnosis not present

## 2018-11-05 DIAGNOSIS — Z23 Encounter for immunization: Secondary | ICD-10-CM | POA: Diagnosis not present

## 2018-11-05 MED ORDER — NORETHIN ACE-ETH ESTRAD-FE 1-20 MG-MCG PO TABS: 1.0000 | ORAL_TABLET | Freq: Every day | ORAL | 3 refills | Status: DC

## 2018-11-05 NOTE — Patient Instructions (Signed)
130/90.  Please let me know if your BP is higher than this.

## 2018-11-05 NOTE — Progress Notes (Signed)
GYNECOLOGY  VISIT  CC:   OCP follow up  HPI: 23 y.o. G0P0000 Single White or Caucasian female here for OCP follow up.  She just finished her third pack of OCPs.  Reports she had a cycle twice in the first pack of pills.  The second month, she had spotting instead of more regular flow.  This was during the placebo week.  This month, the cycle started on time and was more like a typical flow.    Reports she had a very stressful day.  She had her performance evaluation at work.  She is feeling a little stressed.  D/w pt blood pressure results today.    She is going to finish the Gardisil series today.  First injection was 11/15/2010.  I did get her vaccination records for this.  Second injection was given here 06/10/2018.  GYNECOLOGIC HISTORY: Patient's last menstrual period was 10/29/2018 (exact date). Contraception: OCP Menopausal hormone therapy: none  Patient Active Problem List   Diagnosis Date Noted  . Prediabetes 07/31/2018  . Vitamin D deficiency 07/11/2018    Past Medical History:  Diagnosis Date  . Amenorrhea     Past Surgical History:  Procedure Laterality Date  . WISDOM TOOTH EXTRACTION      MEDS:   Current Outpatient Medications on File Prior to Visit  Medication Sig Dispense Refill  . Multiple Vitamin (MULTIVITAMIN) tablet Take 1 tablet by mouth daily.    . norethindrone-ethinyl estradiol (JUNEL FE,GILDESS FE,LOESTRIN FE) 1-20 MG-MCG tablet Take 1 tablet by mouth daily. 3 Package 0   No current facility-administered medications on file prior to visit.     ALLERGIES: Patient has no known allergies.  Family History  Problem Relation Age of Onset  . Diabetes Father   . Miscarriages / [place] Mother   . Breast cancer Maternal Aunt        late 40's, negative genetic testing  . Diabetes Paternal Grandfather   . Diabetes Paternal Grandmother     SH:  Single, non smoker  Review of Systems  All other systems reviewed and are negative.   PHYSICAL  EXAMINATION:    BP (!) 122/94   Pulse 92   Temp 97.7 F (36.5 C) (Temporal)   Ht 5\' 5"  (1.651 m)   Wt 234 lb (106.1 kg)   LMP 10/29/2018 (Exact Date)   BMI 38.94 kg/m     General appearance: alert, cooperative and appears stated age  Assessment: H/o amenorrhea with improved cycles on OCPs Completion of Gardisil vaccination today Possible elevated diastolic BP  Plan: Pt is going to get a home BP cuff and start watching BP.  She is going to let me know if this is greater than 130/90 Rx for loestrin 1/20 Fe to pharmacy.  #3 months/3RF If she decides to stop OCPs, she is going to call and let me know before actually stopping them.   ~15 minutes spent with patient >50% of time was in face to face discussion of above.

## 2018-11-10 IMAGING — DX DG CERVICAL SPINE COMPLETE 4+V
6 series · 6 of 6 positions shown · non-contrast
Comparison: None.

CLINICAL DATA: Cervicalgia

EXAM:
CERVICAL SPINE - COMPLETE 4+ VIEW

[c-spine lat]
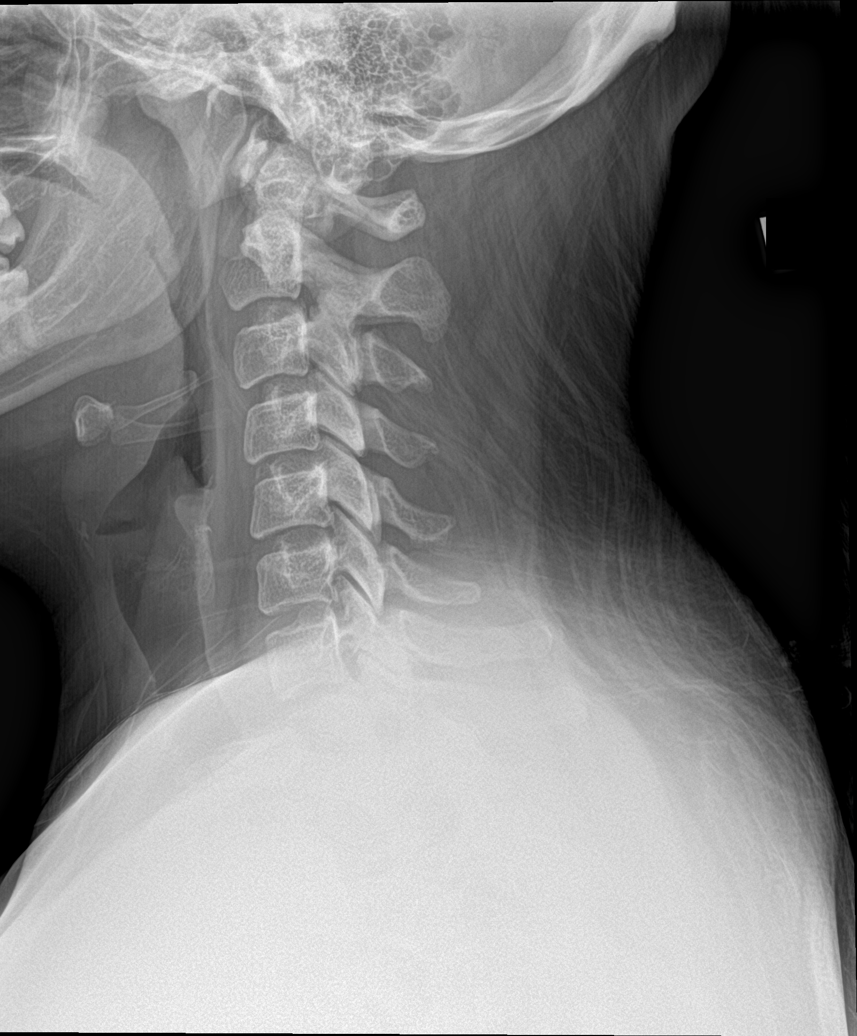

[c-spine obl (1 of 2)]
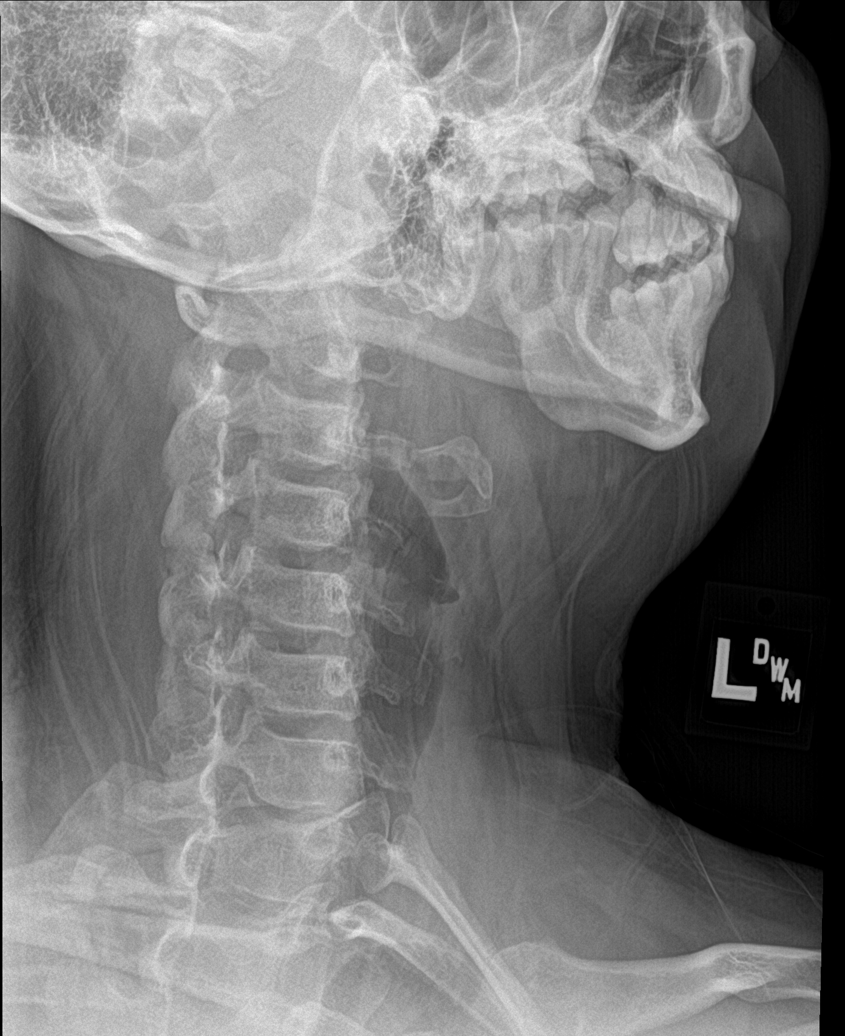

[c-spine obl (2 of 2)]
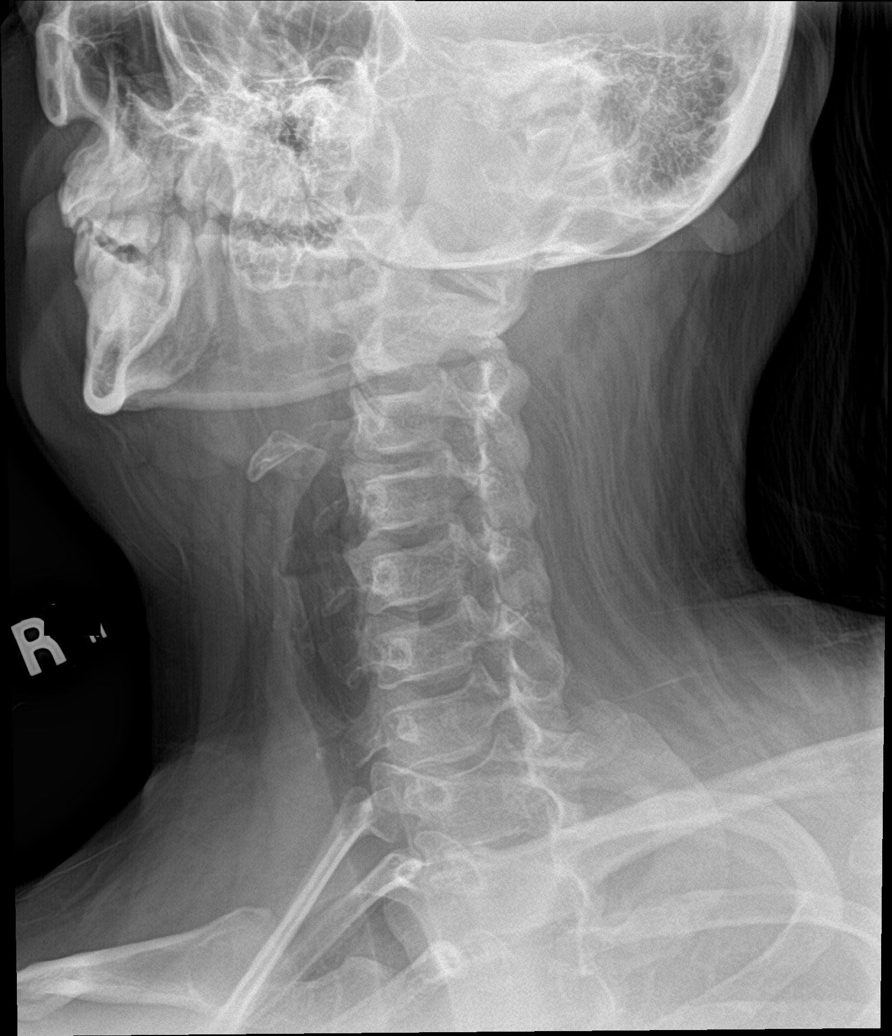

[c-spine ap]
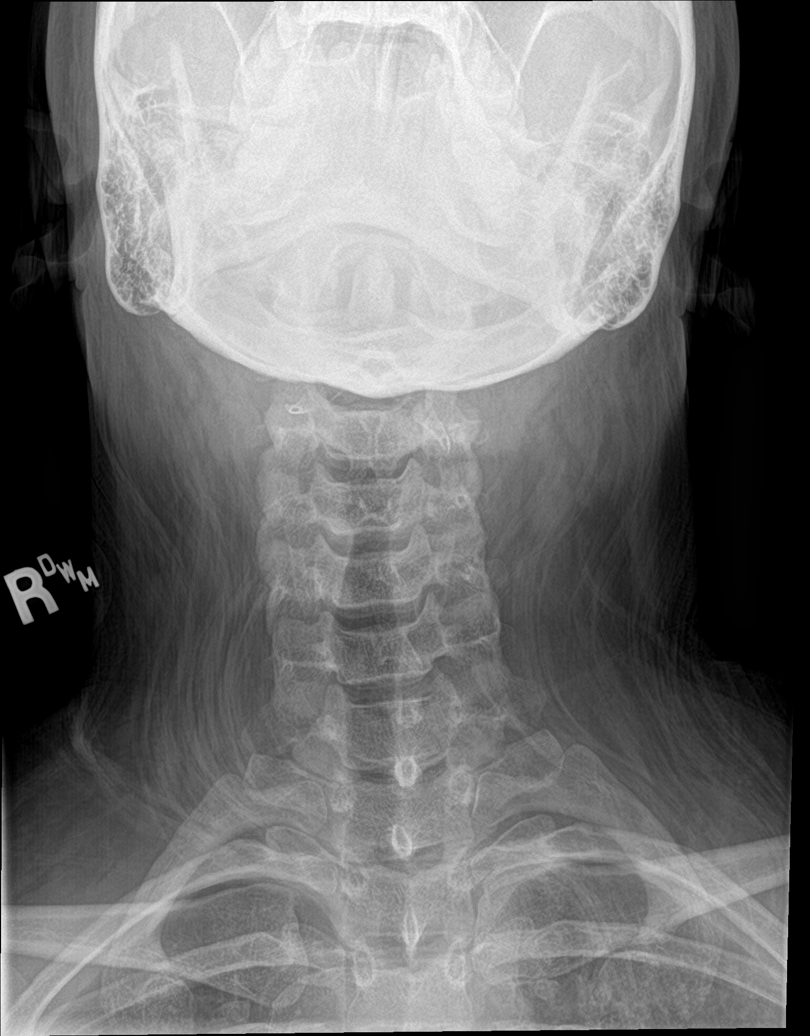

[c-spine open mouth]
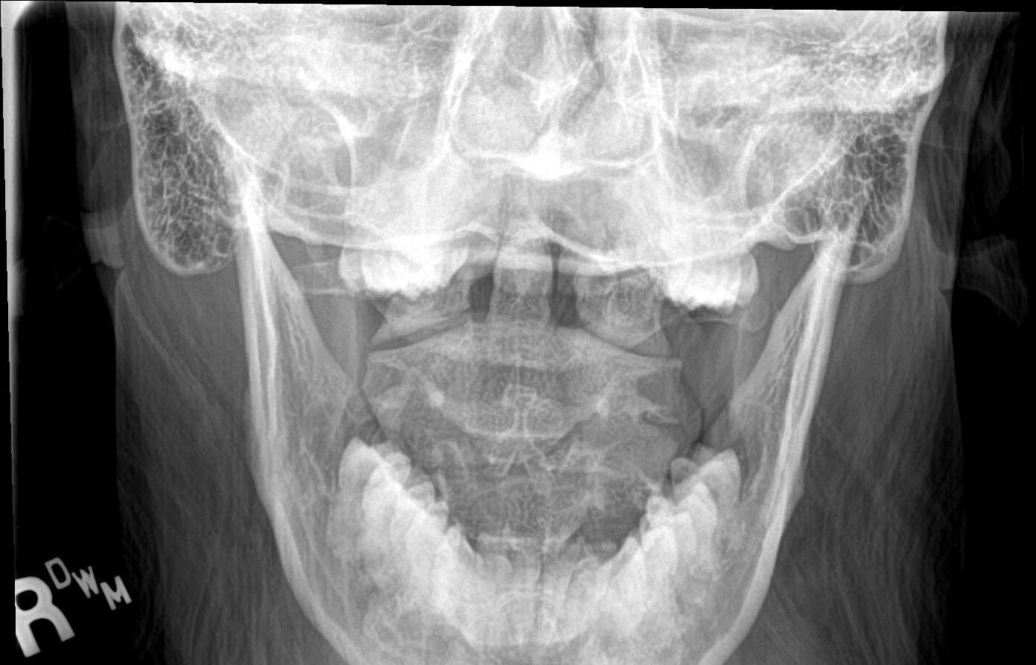

[c-spine swimmers]
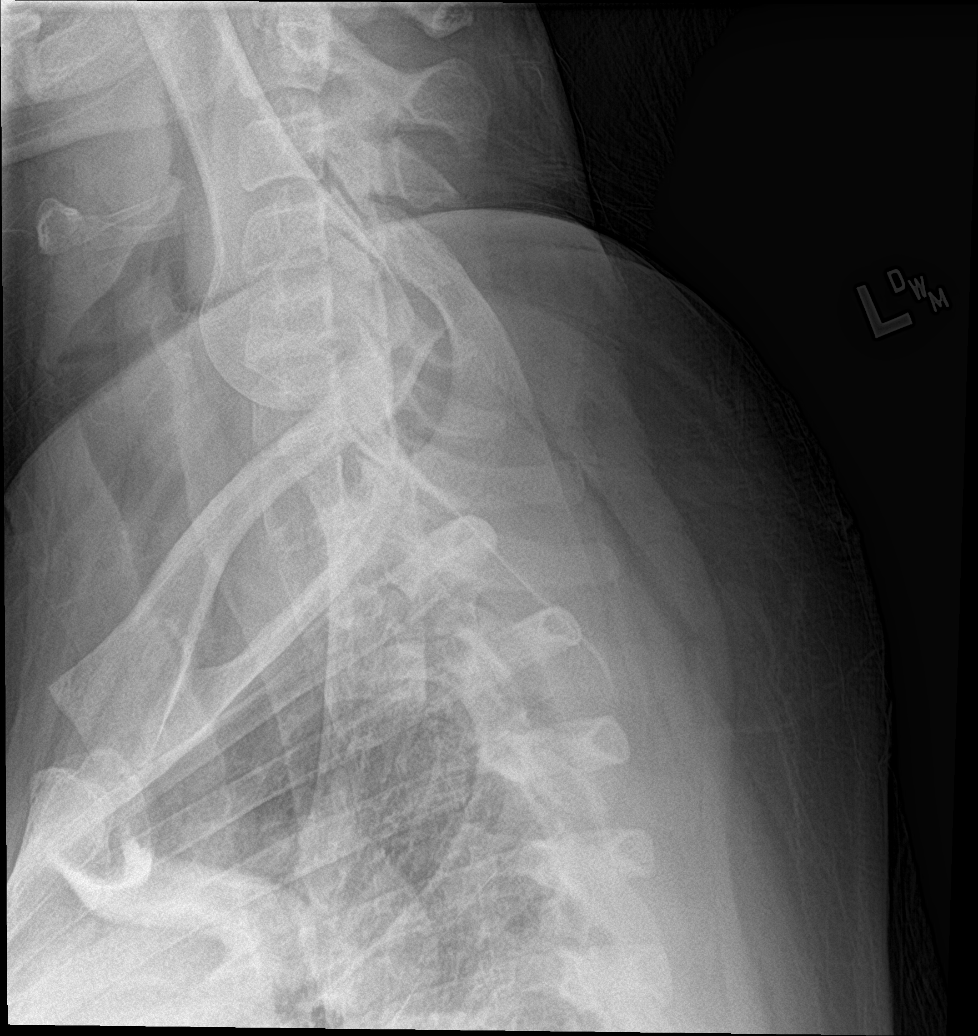

[6 of 6 positions shown; findings below may reference images not displayed]

FINDINGS: Frontal, lateral, open-mouth odontoid, and bilateral oblique views
were obtained. There is no fracture or spondylolisthesis.
Prevertebral soft tissues and predental space regions are normal.
The disc spaces appear normal. There is no appreciable exit
foraminal narrowing on the oblique views.
IMPRESSION: No fracture or spondylolisthesis.  No evident arthropathic change.

## 2018-11-10 IMAGING — DX DG SHOULDER 2+V*R*
3 series · 3 of 3 positions shown · non-contrast
Comparison: None.

CLINICAL DATA: Right shoulder pain for 4 days.

EXAM:
RIGHT SHOULDER - 2+ VIEW

[shoulder grashey]
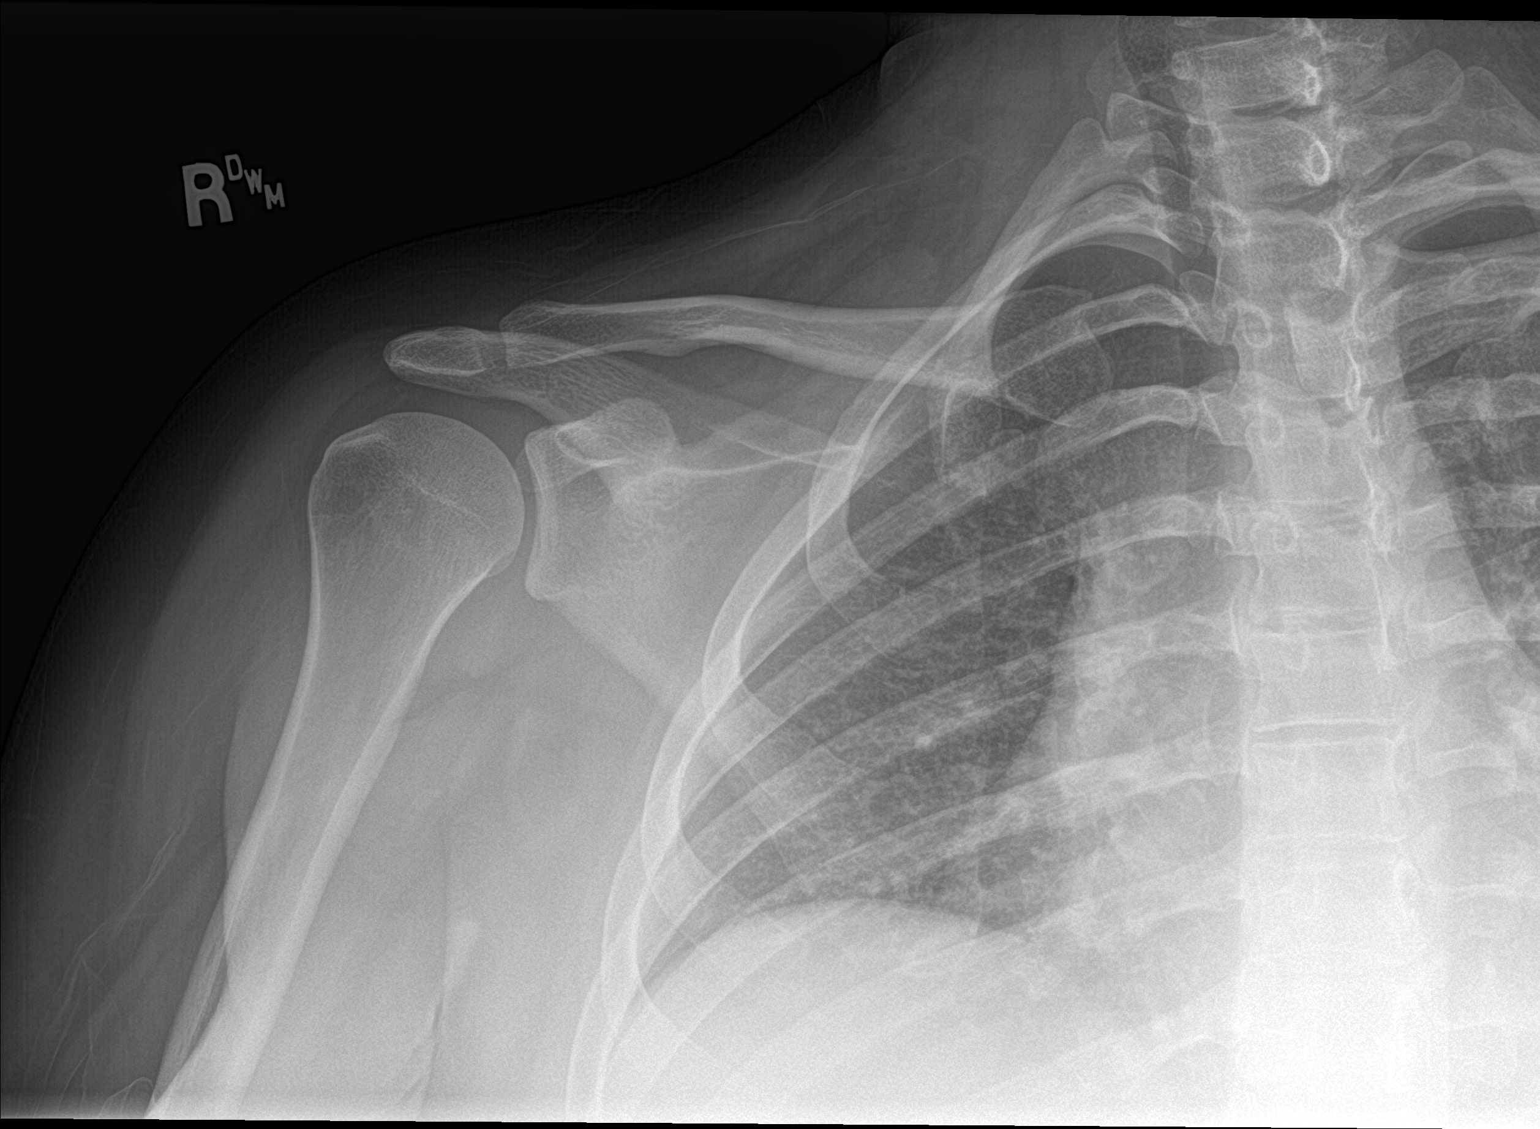

[shoulder y view]
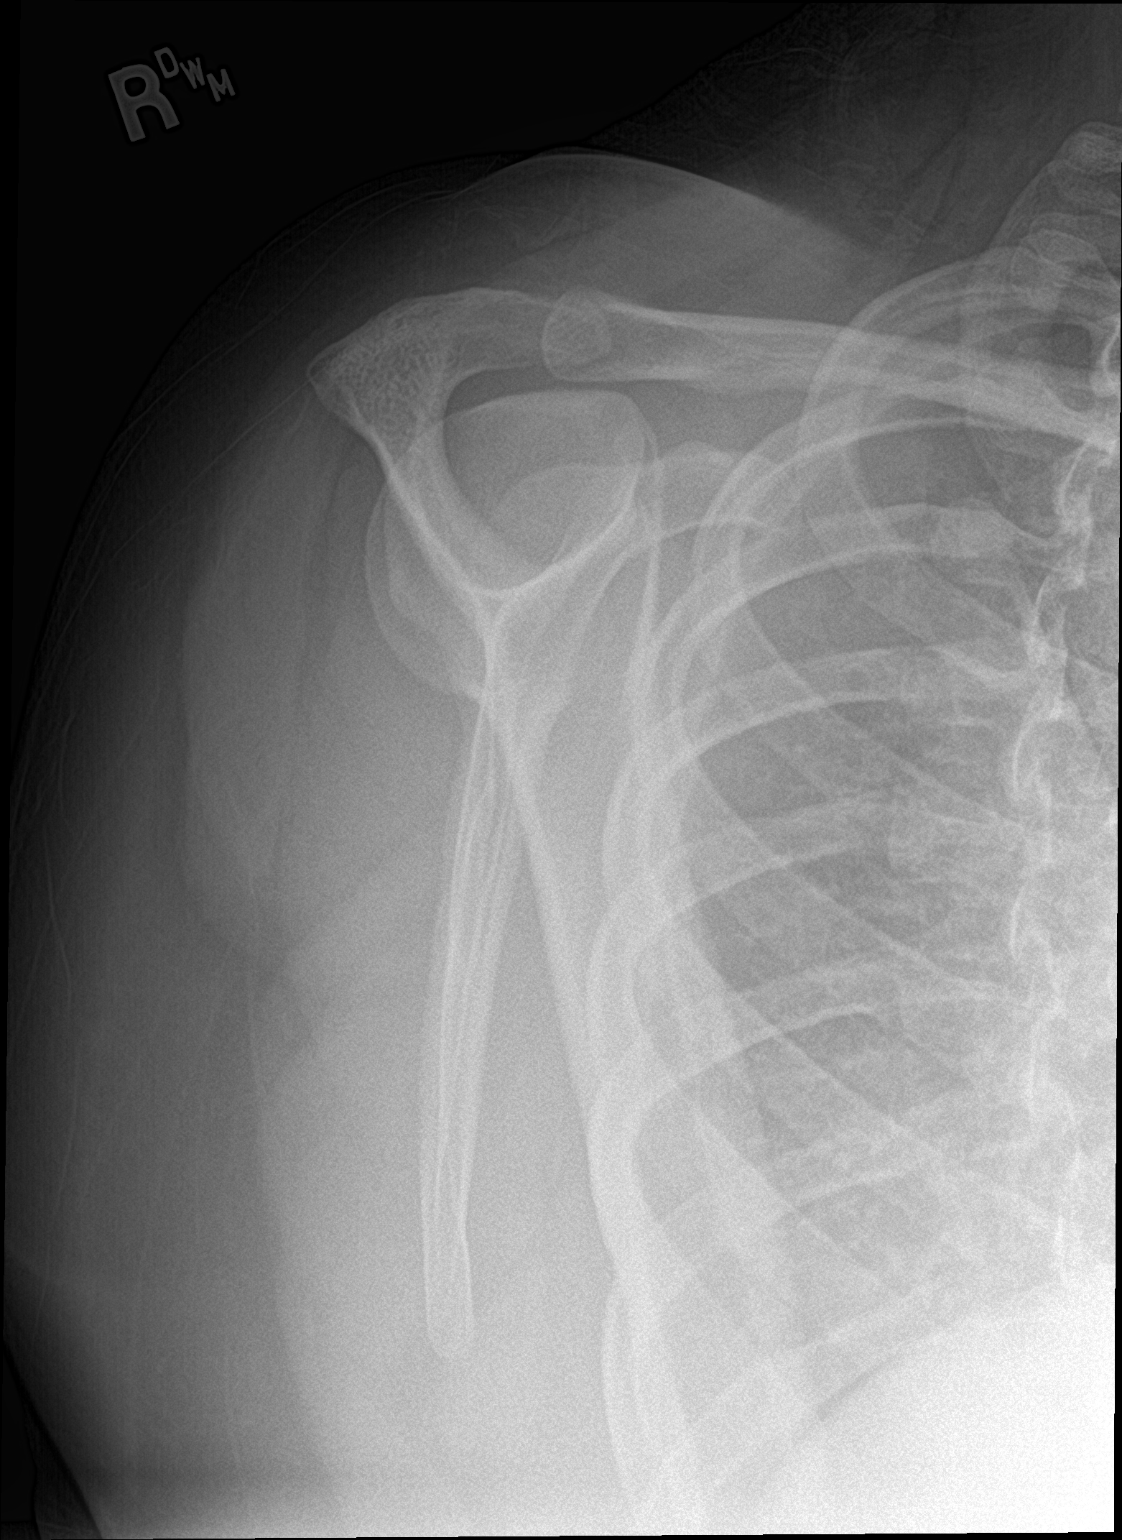

[shoulder axillary]
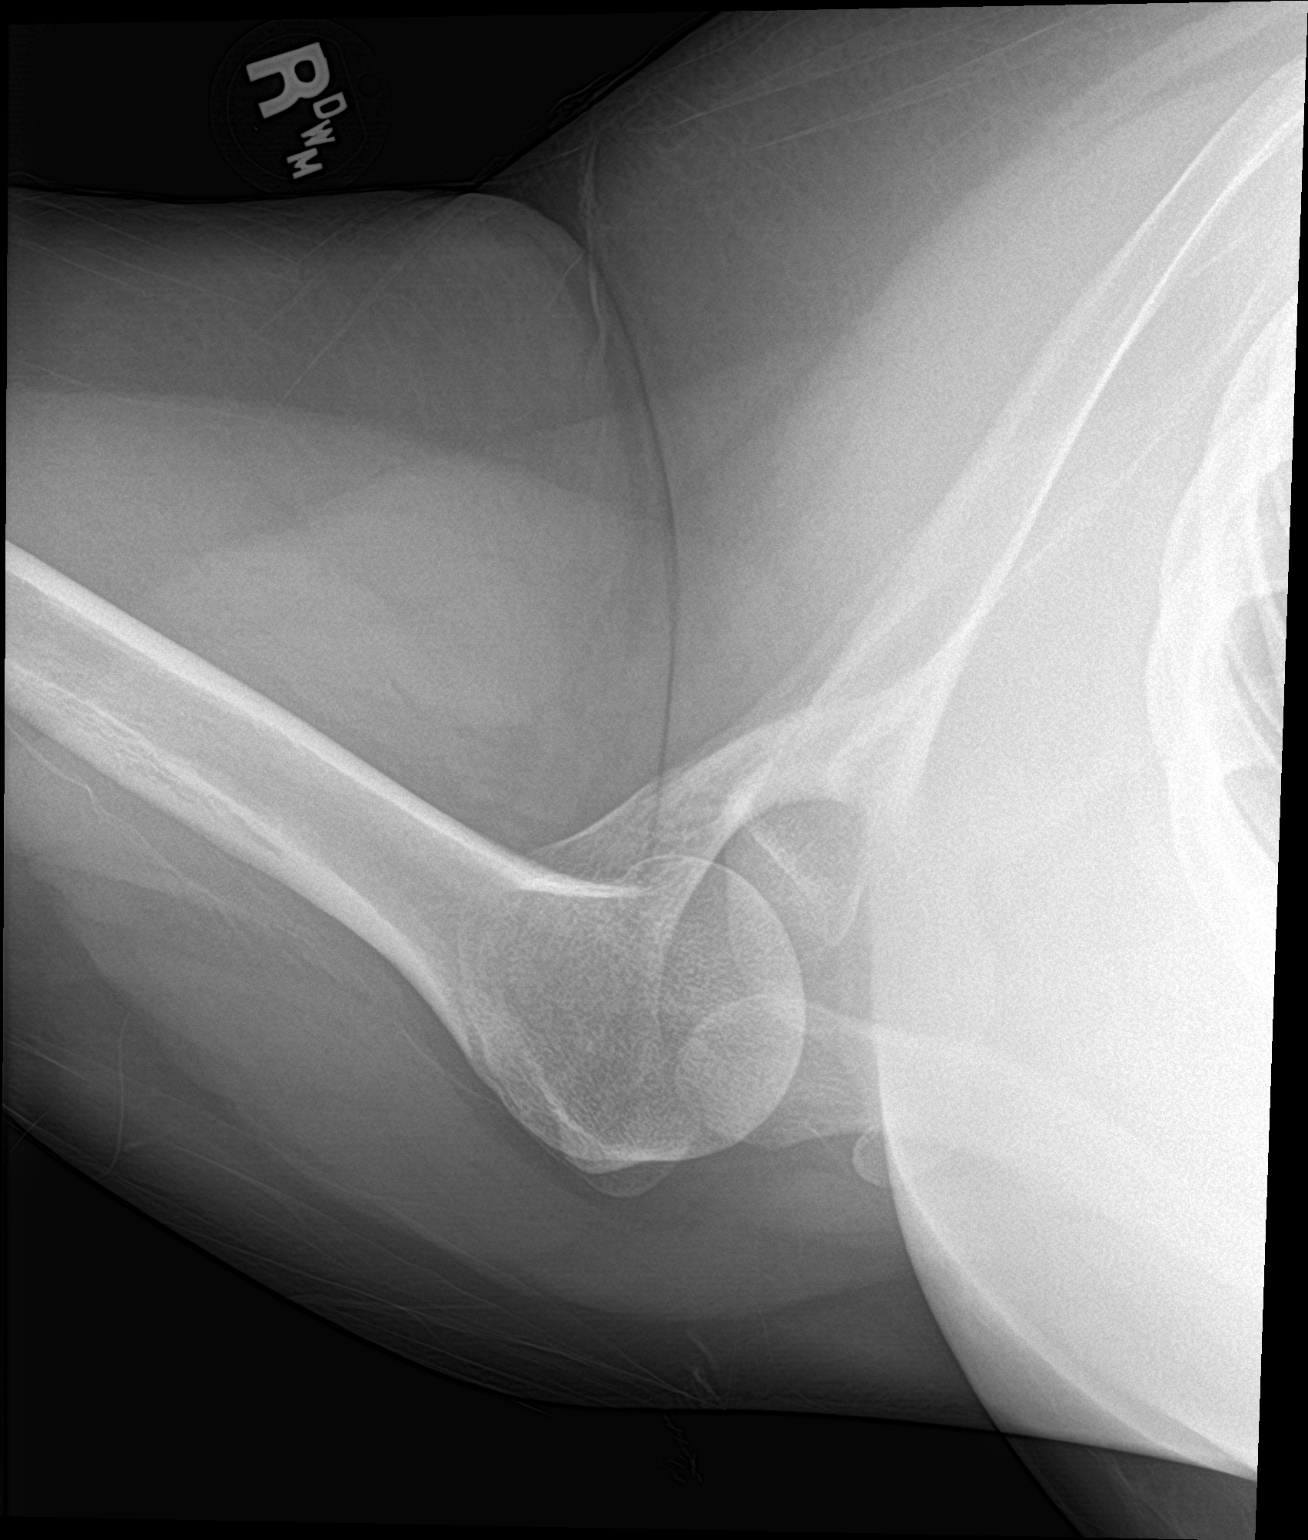

[3 of 3 positions shown; findings below may reference images not displayed]

FINDINGS: No acute fracture. No dislocation.  Unremarkable soft tissues.
IMPRESSION: No acute bony pathology.

## 2019-02-27 ENCOUNTER — Encounter: Payer: Self-pay | Admitting: Family Medicine

## 2019-07-01 ENCOUNTER — Encounter: Payer: Self-pay | Admitting: Obstetrics & Gynecology

## 2019-07-01 ENCOUNTER — Telehealth: Payer: Self-pay | Admitting: Obstetrics & Gynecology

## 2019-07-01 NOTE — Telephone Encounter (Signed)
Spoke to pt. Pt states having regular cycles for 4 months since taking Loestrin OCPs that she started in 10/2018 at OV.  Pt states having no cycle in Jan, Feb. Had last cycle on 04/30/2019 that was "dry" and had old brown blood for 2 days. Pt doesn't have any OCPs left to take in Rx, but wanted to know if this was best for her and regulation.  Pt has not taken UPT. Pt encouraged to take UPT pregnancy test. Pt agreeable. Pt wanting to discuss options to help regulate her cycles. Pt scheduled web visit consult with Dr Hyacinth Meeker on 07/07/2019 at 1130 am. Pt verbalized understanding and explained  how to use mychart visit.   Routing to Dr Hyacinth Meeker for reivew and will close encounter.

## 2019-07-01 NOTE — Telephone Encounter (Signed)
Patient sent the following message through MyChart. Routing to triage to assist patient with request.  [facility]  Phone Number: [phone]  Hello Dr Hyacinth Meeker,   It's been a while since I've seen you. With everything covid related occurring, I've been keeping busy and not had much time to stop and think about previous health concerns. Before you had me on the birth control to regulate my periods. This fixed my cycles for 4 months with 3 months worth. But my periods have stopped once again after those 4 months. I hadn't had a chance to ask you if you think I should start back on them or do anything else with the world in a panic. What are your thoughts?   Jamie Hughes

## 2019-07-04 ENCOUNTER — Other Ambulatory Visit: Payer: Self-pay

## 2019-07-07 ENCOUNTER — Telehealth (INDEPENDENT_AMBULATORY_CARE_PROVIDER_SITE_OTHER): Payer: BC Managed Care – PPO | Admitting: Obstetrics & Gynecology

## 2019-07-07 DIAGNOSIS — N926 Irregular menstruation, unspecified: Secondary | ICD-10-CM

## 2019-07-07 NOTE — Progress Notes (Signed)
Spoke to pt. Pt lunchtime has ended at 12 noon. Pt wanting to reschedule mychart visit. Pt rescheduled virtual visit at 07/29/2019 at 11am. Pt agreeable to date and time.

## 2019-07-14 ENCOUNTER — Encounter: Payer: BC Managed Care – PPO | Admitting: Family Medicine

## 2019-07-29 ENCOUNTER — Telehealth (INDEPENDENT_AMBULATORY_CARE_PROVIDER_SITE_OTHER): Payer: BC Managed Care – PPO | Admitting: Obstetrics & Gynecology

## 2019-07-29 ENCOUNTER — Other Ambulatory Visit: Payer: Self-pay

## 2019-07-29 ENCOUNTER — Encounter: Payer: Self-pay | Admitting: Obstetrics & Gynecology

## 2019-07-29 DIAGNOSIS — E559 Vitamin D deficiency, unspecified: Secondary | ICD-10-CM

## 2019-07-29 DIAGNOSIS — N912 Amenorrhea, unspecified: Secondary | ICD-10-CM | POA: Diagnosis not present

## 2019-07-29 DIAGNOSIS — N926 Irregular menstruation, unspecified: Secondary | ICD-10-CM | POA: Diagnosis not present

## 2019-07-29 DIAGNOSIS — R7303 Prediabetes: Secondary | ICD-10-CM

## 2019-07-29 MED ORDER — NORETHIN ACE-ETH ESTRAD-FE 1-20 MG-MCG PO TABS: 1.0000 | ORAL_TABLET | Freq: Every day | ORAL | 1 refills | Status: DC

## 2019-07-29 NOTE — Progress Notes (Signed)
Virtual Visit via Video Note  I connected with Jamie Hughes on 07/29/19 at 11:00 AM EDT by a video enabled telemedicine application and verified that I am speaking with the correct person using two identifiers.  Location: Patient: her appt Provider: office   I discussed the limitations of evaluation and management by telemedicine and the availability of in person appointments. The patient expressed understanding and agreed to proceed.  History of Present Illness: 24 yo G0 SWF with h/o amenorrhea prior to 07/2018 when she was first seen.  She had evaluation for amenorrhea including TSH, prolactin, FSH and total testosterone level.  Ultrasound was also performed.  Provera challenge initiated that cause menstrual bleeding.  She was then started on OCPs.  Cycles normalized on OCPs which she took for several months.   Then in December, she decided to stop these.  She reports she had a cycle in January that looked "dry and old ".  She also reports that the blood looked dark.  Since that time she had a cycle.  She now realizes that the OCPs will regulate her cycle and is going to restart her medication.  She is not had any new issues since I last saw her in June.    Last pap was 06/10/2018 was normal.  Past medical history, medications, allergies, surgical history is all updated.   Observations/Objective: Obese WF, NAD  Assessment and Plan: 24 yo with h/o irregular menses and amenorrhea in the past with negative evaluation  Follow Up Instructions: She is going to restart her Loestrin FE 1/21 p.o. daily.  Rx to pharmacy.  She does need to have follow-up for blood pressure and annual exam in about 3 months.  She will be called to schedule this.  Would recommend this be an a.m. appointment and come fasting so we could check fasting insulin and free testosterone.   I discussed the assessment and treatment plan with the patient. The patient was provided an opportunity to ask questions and all  were answered. The patient agreed with the plan and demonstrated an understanding of the instructions.   The patient was advised to call back or seek an in-person evaluation if the symptoms worsen or if the condition fails to improve as anticipated.  I provided 20 minutes of non-face-to-face time during this encounter.   Jerene Bears, MD

## 2019-08-02 ENCOUNTER — Encounter: Payer: Self-pay | Admitting: Obstetrics & Gynecology

## 2019-08-04 ENCOUNTER — Encounter: Payer: BC Managed Care – PPO | Admitting: Family Medicine

## 2019-08-21 ENCOUNTER — Encounter: Payer: BC Managed Care – PPO | Admitting: Family Medicine

## 2019-09-04 ENCOUNTER — Encounter: Payer: Self-pay | Admitting: Family Medicine

## 2019-09-04 ENCOUNTER — Ambulatory Visit (INDEPENDENT_AMBULATORY_CARE_PROVIDER_SITE_OTHER): Payer: BC Managed Care – PPO | Admitting: Family Medicine

## 2019-09-04 ENCOUNTER — Other Ambulatory Visit: Payer: Self-pay

## 2019-09-04 VITALS — BP 120/88 | HR 85 | Temp 98.0°F | Ht 65.5 in | Wt 230.6 lb

## 2019-09-04 DIAGNOSIS — R7303 Prediabetes: Secondary | ICD-10-CM | POA: Diagnosis not present

## 2019-09-04 DIAGNOSIS — Z0000 Encounter for general adult medical examination without abnormal findings: Secondary | ICD-10-CM

## 2019-09-04 DIAGNOSIS — E559 Vitamin D deficiency, unspecified: Secondary | ICD-10-CM

## 2019-09-04 NOTE — Patient Instructions (Signed)
Preventive Care 21-25 Years Old, Female Preventive care refers to visits with your health care provider and lifestyle choices that can promote health and wellness. This includes:  A yearly physical exam. This may also be called an annual well check.  Regular dental visits and eye exams.  Immunizations.  Screening for certain conditions.  Healthy lifestyle choices, such as eating a healthy diet, getting regular exercise, not using drugs or products that contain nicotine and tobacco, and limiting alcohol use. What can I expect for my preventive care visit? Physical exam Your health care provider will check your:  Height and weight. This may be used to calculate body mass index (BMI), which tells if you are at a healthy weight.  Heart rate and blood pressure.  Skin for abnormal spots. Counseling Your health care provider may ask you questions about your:  Alcohol, tobacco, and drug use.  Emotional well-being.  Home and relationship well-being.  Sexual activity.  Eating habits.  Work and work environment.  Method of birth control.  Menstrual cycle.  Pregnancy history. What immunizations do I need?  Influenza (flu) vaccine  This is recommended every year. Tetanus, diphtheria, and pertussis (Tdap) vaccine  You may need a Td booster every 10 years. Varicella (chickenpox) vaccine  You may need this if you have not been vaccinated. Human papillomavirus (HPV) vaccine  If recommended by your health care provider, you may need three doses over 6 months. Measles, mumps, and rubella (MMR) vaccine  You may need at least one dose of MMR. You may also need a second dose. Meningococcal conjugate (MenACWY) vaccine  One dose is recommended if you are age 19-21 years and a first-year college student living in a residence hall, or if you have one of several medical conditions. You may also need additional booster doses. Pneumococcal conjugate (PCV13) vaccine  You may need  this if you have certain conditions and were not previously vaccinated. Pneumococcal polysaccharide (PPSV23) vaccine  You may need one or two doses if you smoke cigarettes or if you have certain conditions. Hepatitis A vaccine  You may need this if you have certain conditions or if you travel or work in places where you may be exposed to hepatitis A. Hepatitis B vaccine  You may need this if you have certain conditions or if you travel or work in places where you may be exposed to hepatitis B. Haemophilus influenzae type b (Hib) vaccine  You may need this if you have certain conditions. You may receive vaccines as individual doses or as more than one vaccine together in one shot (combination vaccines). Talk with your health care provider about the risks and benefits of combination vaccines. What tests do I need?  Blood tests  Lipid and cholesterol levels. These may be checked every 5 years starting at age 20.  Hepatitis C test.  Hepatitis B test. Screening  Diabetes screening. This is done by checking your blood sugar (glucose) after you have not eaten for a while (fasting).  Sexually transmitted disease (STD) testing.  BRCA-related cancer screening. This may be done if you have a family history of breast, ovarian, tubal, or peritoneal cancers.  Pelvic exam and Pap test. This may be done every 3 years starting at age 21. Starting at age 30, this may be done every 5 years if you have a Pap test in combination with an HPV test. Talk with your health care provider about your test results, treatment options, and if necessary, the need for more tests.   Follow these instructions at home: Eating and drinking   Eat a diet that includes fresh fruits and vegetables, whole grains, lean protein, and low-fat dairy.  Take vitamin and mineral supplements as recommended by your health care provider.  Do not drink alcohol if: ? Your health care provider tells you not to drink. ? You are  pregnant, may be pregnant, or are planning to become pregnant.  If you drink alcohol: ? Limit how much you have to 0-1 drink a day. ? Be aware of how much alcohol is in your drink. In the [place] one drink equals one 12 oz bottle of beer (355 mL), one 5 oz glass of wine (148 mL), or one 1 oz glass of hard liquor (44 mL). Lifestyle  Take daily care of your teeth and gums.  Stay active. Exercise for at least 30 minutes on 5 or more days each week.  Do not use any products that contain nicotine or tobacco, such as cigarettes, e-cigarettes, and chewing tobacco. If you need help quitting, ask your health care provider.  If you are sexually active, practice safe sex. Use a condom or other form of birth control (contraception) in order to prevent pregnancy and STIs (sexually transmitted infections). If you plan to become pregnant, see your health care provider for a preconception visit. What's next?  Visit your health care provider once a year for a well check visit.  Ask your health care provider how often you should have your eyes and teeth checked.  Stay up to date on all vaccines. This information is not intended to replace advice given to you by your health care provider. Make sure you discuss any questions you have with your health care provider. Document Revised: 01/10/2018 Document Reviewed: 01/10/2018 Elsevier Patient Education  2020 [employer].

## 2019-09-04 NOTE — Progress Notes (Signed)
Patient: Jamie Hughes MRN: [id number] DOB: 04-02-1996 PCP: Orma Flaming, MD     Subjective:  Chief Complaint  Patient presents with  . Annual Exam    Pt has no complaints today.    HPI: The patient is a 24 y.o. female who presents today for annual exam. She denies any changes to past medical history. There have been no recent hospitalizations. They are following a well balanced diet and exercise plan. She says she sometimes does curls or use the treadmill. Weight has increased. 10 pounds since I saw her last year. No complaints today.   No first degree relative with breast cancer or colon cancer.   Her sister wonders if she has adhd. No issues as a child or in school or college. completing work fine now. She does have lots of thoughts and has a hard time staying on one topic.   Immunization History  Administered Date(s) Administered  . DTaP 09/15/2010  . HPV 9-valent 06/10/2018, 11/05/2018  . Hpv 11/15/2010  . Varicella 06/30/1996, 09/15/2010   Colonoscopy: routine screening  Mammogram: routine screening.  Pap smear: 06/10/2018-normal    Review of Systems  Constitutional: Negative for chills, fatigue and fever.  HENT: Negative for congestion and sore throat.   Respiratory: Negative for chest tightness, shortness of breath and wheezing.   Cardiovascular: Negative for chest pain and palpitations.  Gastrointestinal: Negative for abdominal pain, nausea and vomiting.  Genitourinary: Negative for frequency, pelvic pain and urgency.    Allergies Patient has No Known Allergies.  Past Medical History Patient  has a past medical history of Amenorrhea (2020), Prediabetes, and Vitamin D deficiency.  Surgical History Patient  has a past surgical history that includes Wisdom tooth extraction.  Family History Pateint's family history includes Breast cancer in her maternal aunt; Diabetes in her father, paternal grandfather, and paternal grandmother; Miscarriages /  [place] in her mother.  Social History Patient  reports that she has never smoked. She has never used smokeless tobacco. She reports current alcohol use. She reports that she does not use drugs.    Objective: Vitals:   09/04/19 1425  BP: 120/88  Pulse: 85  Temp: 98 F (36.7 C)  TempSrc: Temporal  SpO2: 98%  Weight: 230 lb 9.6 oz (104.6 kg)  Height: 5' 5.5" (1.664 m)    Body mass index is 37.79 kg/m.  Physical Exam Vitals reviewed.  Constitutional:      Appearance: Normal appearance. She is well-developed. She is obese.  HENT:     Head: Normocephalic and atraumatic.     Right Ear: Tympanic membrane, ear canal and external ear normal.     Left Ear: Tympanic membrane, ear canal and external ear normal.     Nose: Nose normal.     Mouth/Throat:     Mouth: Mucous membranes are moist.  Eyes:     Extraocular Movements: Extraocular movements intact.     Conjunctiva/sclera: Conjunctivae normal.     Pupils: Pupils are equal, round, and reactive to light.  Neck:     Thyroid: No thyromegaly.  Cardiovascular:     Rate and Rhythm: Normal rate and regular rhythm.     Pulses: Normal pulses.     Heart sounds: Normal heart sounds. No murmur.  Pulmonary:     Effort: Pulmonary effort is normal.     Breath sounds: Normal breath sounds.  Abdominal:     General: Bowel sounds are normal. There is no distension.     Palpations: Abdomen is soft.  Tenderness: There is no abdominal tenderness.  Musculoskeletal:     Cervical back: Normal range of motion and neck supple.  Lymphadenopathy:     Cervical: No cervical adenopathy.  Skin:    General: Skin is warm and dry.     Capillary Refill: Capillary refill takes less than 2 seconds.     Findings: No rash.  Neurological:     General: No focal deficit present.     Mental Status: She is alert and oriented to person, place, and time.     Cranial Nerves: No cranial nerve deficit.     Coordination: Coordination normal.     Deep Tendon  Reflexes: Reflexes normal.  Psychiatric:        Mood and Affect: Mood normal.        Behavior: Behavior normal.      Office Visit from 09/04/2019 in [facility]  PHQ-2 Total Score  0         Assessment/plan: 1. Annual physical exam Routine fasting labs today. HM reviewed and UTD. Discussed weight gain and steps to lose weight. Encouraged exercise and diet changes. Handout given to get tested for ADHD/ADD if she decides she needs this, but discussed if she is managing fine at her job, relationship with family and co workers is okay I don't necessarily think she needs medication. 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 She has offices to call for adhd/add testing and can follow up with me once that is complete. She will think on this. f/u in one year or sooner if needed.  Patient counseling [x]    Nutrition: Stressed importance of moderation in sodium/caffeine intake, saturated fat and cholesterol, caloric balance, sufficient intake of fresh fruits, vegetables, fiber, calcium, iron, and 1 mg of folate supplement per day (for females capable of pregnancy).  [x]    Stressed the importance of regular exercise.   []    Substance Abuse: Discussed cessation/primary prevention of tobacco, alcohol, or other drug use; driving or other dangerous activities under the influence; availability of treatment for abuse.   [x]    Injury prevention: Discussed safety belts, safety helmets, smoke detector, smoking near bedding or upholstery.   [x]    Sexuality: Discussed sexually transmitted diseases, partner selection, use of condoms, avoidance of unintended pregnancy  and contraceptive alternatives.  [x]    Dental health: Discussed importance of regular tooth brushing, flossing, and dental visits.  [x]    Health maintenance and immunizations reviewed. Please refer to Health maintenance section.    - CBC with Differential/Platelet - Comprehensive metabolic  panel - Lipid panel - TSH  2. Prediabetes She has gained 10 pounds and encouraged her to lose weight. Check a1c today and follow up on this. Has not been checked since 2016. F/u with results.  - Hemoglobin A1c  3. Vitamin D deficiency  - VITAMIN D 25 Hydroxy (Vit-D Deficiency, Fractures)  -diastolic blood pressure elevated. Encouraged weight loss, exercise, diet changes. She will watch with cuff and let me know if diastolic >90.   This visit occurred during the SARS-CoV-2 public health emergency.  Safety protocols were in place, including screening questions prior to the visit, additional usage of staff PPE, and extensive cleaning of exam room while observing appropriate contact time as indicated for disinfecting solutions.    Return in about 1 year (around 09/03/2020).     , MD [facility]  09/04/2019

## 2019-09-05 ENCOUNTER — Other Ambulatory Visit: Payer: Self-pay | Admitting: Family Medicine

## 2019-09-05 DIAGNOSIS — E1165 Type 2 diabetes mellitus with hyperglycemia: Secondary | ICD-10-CM | POA: Insufficient documentation

## 2019-09-05 DIAGNOSIS — E559 Vitamin D deficiency, unspecified: Secondary | ICD-10-CM

## 2019-09-05 LAB — CBC WITH DIFFERENTIAL/PLATELET
Basophils Absolute: 0.1 10*3/uL (ref 0.0–0.1)
Basophils Relative: 1.3 % (ref 0.0–3.0)
Eosinophils Absolute: 0.1 10*3/uL (ref 0.0–0.7)
Eosinophils Relative: 1.7 % (ref 0.0–5.0)
HCT: 42.4 % (ref 36.0–46.0)
Hemoglobin: 14.6 g/dL (ref 12.0–15.0)
Lymphocytes Relative: 32 % (ref 12.0–46.0)
Lymphs Abs: 2.4 10*3/uL (ref 0.7–4.0)
MCHC: 34.3 g/dL (ref 30.0–36.0)
MCV: 84.7 fl (ref 78.0–100.0)
Monocytes Absolute: 0.5 10*3/uL (ref 0.1–1.0)
Monocytes Relative: 7.2 % (ref 3.0–12.0)
Neutro Abs: 4.3 10*3/uL (ref 1.4–7.7)
Neutrophils Relative %: 57.8 % (ref 43.0–77.0)
Platelets: 307 10*3/uL (ref 150.0–400.0)
RBC: 5.01 Mil/uL (ref 3.87–5.11)
RDW: 13 % (ref 11.5–15.5)
WBC: 7.4 10*3/uL (ref 4.0–10.5)

## 2019-09-05 LAB — COMPREHENSIVE METABOLIC PANEL
ALT: 43 U/L — ABNORMAL HIGH (ref 0–35)
AST: 39 U/L — ABNORMAL HIGH (ref 0–37)
Albumin: 4.1 g/dL (ref 3.5–5.2)
Alkaline Phosphatase: 82 U/L (ref 39–117)
BUN: 13 mg/dL (ref 6–23)
CO2: 25 mEq/L (ref 19–32)
Calcium: 9.2 mg/dL (ref 8.4–10.5)
Chloride: 98 mEq/L (ref 96–112)
Creatinine, Ser: 0.69 mg/dL (ref 0.40–1.20)
GFR: 104.14 mL/min (ref >60.00)
Glucose, Bld: 339 mg/dL — ABNORMAL HIGH (ref 70–99)
Potassium: 4.3 mEq/L (ref 3.5–5.1)
Sodium: 133 mEq/L — ABNORMAL LOW (ref 135–145)
Total Bilirubin: 0.5 mg/dL (ref 0.2–1.2)
Total Protein: 7.1 g/dL (ref 6.0–8.3)

## 2019-09-05 LAB — LIPID PANEL
Cholesterol: 173 mg/dL (ref 0–200)
HDL: 27.2 mg/dL — ABNORMAL LOW (ref >39.00)
NonHDL: 145.63
Total CHOL/HDL Ratio: 6
Triglycerides: 287 mg/dL — ABNORMAL HIGH (ref 0.0–149.0)
VLDL: 57.4 mg/dL — ABNORMAL HIGH (ref 0.0–40.0)

## 2019-09-05 LAB — VITAMIN D 25 HYDROXY (VIT D DEFICIENCY, FRACTURES): VITD: 11.44 ng/mL — ABNORMAL LOW (ref 30.00–100.00)

## 2019-09-05 LAB — HEMOGLOBIN A1C: Hgb A1c MFr Bld: 11.9 % — ABNORMAL HIGH (ref 4.6–6.5)

## 2019-09-05 LAB — TSH: TSH: 1.08 u[IU]/mL (ref 0.35–4.50)

## 2019-09-05 LAB — LDL CHOLESTEROL, DIRECT: Direct LDL: 113 mg/dL

## 2019-09-05 MED ORDER — METFORMIN HCL 1000 MG PO TABS: ORAL_TABLET | ORAL | 3 refills | Status: DC

## 2019-09-05 MED ORDER — VITAMIN D (ERGOCALCIFEROL) 1.25 MG (50000 UNIT) PO CAPS: ORAL_CAPSULE | ORAL | 0 refills | Status: DC

## 2019-09-08 ENCOUNTER — Telehealth (INDEPENDENT_AMBULATORY_CARE_PROVIDER_SITE_OTHER): Payer: BC Managed Care – PPO | Admitting: Family Medicine

## 2019-09-08 ENCOUNTER — Encounter: Payer: Self-pay | Admitting: Family Medicine

## 2019-09-08 ENCOUNTER — Telehealth: Payer: BC Managed Care – PPO | Admitting: Family Medicine

## 2019-09-08 VITALS — Ht 65.5 in | Wt 227.3 lb

## 2019-09-08 DIAGNOSIS — E559 Vitamin D deficiency, unspecified: Secondary | ICD-10-CM | POA: Diagnosis not present

## 2019-09-08 DIAGNOSIS — R748 Abnormal levels of other serum enzymes: Secondary | ICD-10-CM

## 2019-09-08 DIAGNOSIS — E1165 Type 2 diabetes mellitus with hyperglycemia: Secondary | ICD-10-CM

## 2019-09-08 NOTE — Progress Notes (Signed)
Patient: Jamie Hughes MRN: [id number] DOB: 1995-06-04 PCP: Orma Flaming, MD     I connected with Jamie Hughes on 09/08/19 at 1:44pm by a video enabled telemedicine application and verified that I am speaking with the correct person using two identifiers.  Location patient: Home Location provider: [facility], Office Persons participating in this virtual visit: Jahanna Raether and Dr. Rogers Blocker   I discussed the limitations of evaluation and management by telemedicine and the availability of in person appointments. The patient expressed understanding and agreed to proceed.   Subjective:  Chief Complaint  Patient presents with  . Diabetes    Pt says that she has switched up her diet and sleep schedule. She is getting 9 hrs of sleep. She states that she hasn't felt this good in a long time.     HPI: The patient is a 24 y.o. female who presents today for newly diagnosed type II Diabetes.  Diabetes: Patient is here for follow up of type 2 diabetes. First diagnosed 08/2019.  Currently on the following medications metformin 1000mg  bid. Takes medications as prescribed. Last A1C was 11.9. Currently not exercising and not following diabetic diet.  Denies any hypoglycemic events. Denies any vision changes, nausea, vomiting, abdominal pain, ulcers/paraesthesia in feet, polyuria, polydipsia or polyphagia. Denies any chest pain, shortness of breath.   This is a new diagnosis for her and she has lots of questions.   Vitamin D deficiency: quite low at 11. repleting this.   Elevated liver enzymes Likely due to fatty liver. She hardly drinks and doesn't take tylenol.   Review of Systems  Constitutional: Positive for appetite change. Negative for fatigue and fever.  Respiratory: Negative for shortness of breath and wheezing.   Cardiovascular: Negative for chest pain and leg swelling.  Genitourinary: Positive for frequency. Negative for urgency.  Neurological: Negative for  dizziness, light-headedness and headaches.    Allergies Patient has No Known Allergies.  Past Medical History Patient  has a past medical history of Amenorrhea (2020), DM (diabetes mellitus) ([place]), Prediabetes, and Vitamin D deficiency.  Surgical History Patient  has a past surgical history that includes Wisdom tooth extraction.  Family History Pateint's family history includes Breast cancer in her maternal aunt; Diabetes in her father, paternal grandfather, and paternal grandmother; Miscarriages / [place] in her mother.  Social History Patient  reports that she has never smoked. She has never used smokeless tobacco. She reports current alcohol use. She reports that she does not use drugs.    Objective: Vitals:   09/08/19 1327  Weight: 227 lb 4.8 oz (103.1 kg)  Height: 5' 5.5" (1.664 m)    Body mass index is 37.25 kg/m.  Physical Exam Vitals reviewed.  Constitutional:      Appearance: Normal appearance. She is obese.  HENT:     Head: Normocephalic and atraumatic.  Pulmonary:     Effort: Pulmonary effort is normal.  Neurological:     Mental Status: She is alert.      Office Visit from 09/04/2019 in [facility]  PHQ-2 Total Score  0          Assessment/plan: 1. Type 2 diabetes mellitus with hyperglycemia, without long-term current use of insulin (HCC) -starting her on metformin with titration up to 1000mg  Bid.  -drastically changing her diet/lifestyle and starting exercise -discussed in detail complications of uncontrolled diabetes -discussed depression is common with diagnosis so she is to watch this, but feels good right now.  -will come in for  pneumovax 23 vaccine -recommended yearly eye exam -referral to diabetic nutrition/education -discussed statin therapy and recommendations to treat diabetic patients. We are going to give her 3 months to change her lifestyle and take the metformin as she doesn't want another drug to take at this  time.  -answered all questions.  -f/u in 3 months time.  Need UP/UC!  - Referral to Nutrition and Diabetes Services  2. Elevated liver enzymes Likely secondary to fatty liver. Discussed exercise/weight loss are essential. Will continue to monitor and recheck in 3 months.   3. Vitamin D deficiency repleting her weekly. Quite deficient. Recheck in 4 months.     Return in about 3 months (around 12/08/2019) for diabetes.  Records requested if needed. Time spent with patient: 28 minutes, of which >50% was spent in obtaining information about her symptoms, reviweing her previous labs, evaluations, and treatments, counseling her about her conditions (please see discussed topics above), and developing a plan to further investigate it; she had a number of questions which I addressed.    Orland Mustard, MD [facility]  09/08/2019

## 2019-09-08 NOTE — Patient Instructions (Signed)
Diabetes Mellitus and Nutrition, Adult When you have diabetes (diabetes mellitus), it is very important to have healthy eating habits because your blood sugar (glucose) levels are greatly affected by what you eat and drink. Eating healthy foods in the appropriate amounts, at about the same times every day, can help you:  Control your blood glucose.  Lower your risk of heart disease.  Improve your blood pressure.  Reach or maintain a healthy weight. Every person with diabetes is different, and each person has different needs for a meal plan. Your health care provider may recommend that you work with a diet and nutrition specialist (dietitian) to make a meal plan that is best for you. Your meal plan may vary depending on factors such as:  The calories you need.  The medicines you take.  Your weight.  Your blood glucose, blood pressure, and cholesterol levels.  Your activity level.  Other health conditions you have, such as heart or kidney disease. How do carbohydrates affect me? Carbohydrates, also called carbs, affect your blood glucose level more than any other type of food. Eating carbs naturally raises the amount of glucose in your blood. Carb counting is a method for keeping track of how many carbs you eat. Counting carbs is important to keep your blood glucose at a healthy level, especially if you use insulin or take certain oral diabetes medicines. It is important to know how many carbs you can safely have in each meal. This is different for every person. Your dietitian can help you calculate how many carbs you should have at each meal and for each snack. Foods that contain carbs include:  Bread, cereal, rice, pasta, and crackers.  Potatoes and corn.  Peas, beans, and lentils.  Milk and yogurt.  Fruit and juice.  Desserts, such as cakes, cookies, ice cream, and candy. How does alcohol affect me? Alcohol can cause a sudden decrease in blood glucose (hypoglycemia),  especially if you use insulin or take certain oral diabetes medicines. Hypoglycemia can be a life-threatening condition. Symptoms of hypoglycemia (sleepiness, dizziness, and confusion) are similar to symptoms of having too much alcohol. If your health care provider says that alcohol is safe for you, follow these guidelines:  Limit alcohol intake to no more than 1 drink per day for nonpregnant women and 2 drinks per day for men. One drink equals 12 oz of beer, 5 oz of wine, or 1 oz of hard liquor.  Do not drink on an empty stomach.  Keep yourself hydrated with water, diet soda, or unsweetened iced tea.  Keep in mind that regular soda, juice, and other mixers may contain a lot of sugar and must be counted as carbs. What are tips for following this plan?  Reading food labels  Start by checking the serving size on the "Nutrition Facts" label of packaged foods and drinks. The amount of calories, carbs, fats, and other nutrients listed on the label is based on one serving of the item. Many items contain more than one serving per package.  Check the total grams (g) of carbs in one serving. You can calculate the number of servings of carbs in one serving by dividing the total carbs by 15. For example, if a food has 30 g of total carbs, it would be equal to 2 servings of carbs.  Check the number of grams (g) of saturated and trans fats in one serving. Choose foods that have low or no amount of these fats.  Check the number of   milligrams (mg) of salt (sodium) in one serving. Most people should limit total sodium intake to less than 2,300 mg per day.  Always check the nutrition information of foods labeled as "low-fat" or "nonfat". These foods may be higher in added sugar or refined carbs and should be avoided.  Talk to your dietitian to identify your daily goals for nutrients listed on the label. Shopping  Avoid buying canned, premade, or processed foods. These foods tend to be high in fat, sodium,  and added sugar.  Shop around the outside edge of the grocery store. This includes fresh fruits and vegetables, bulk grains, fresh meats, and fresh dairy. Cooking  Use low-heat cooking methods, such as baking, instead of high-heat cooking methods like deep frying.  Cook using healthy oils, such as olive, canola, or sunflower oil.  Avoid cooking with butter, cream, or high-fat meats. Meal planning  Eat meals and snacks regularly, preferably at the same times every day. Avoid going long periods of time without eating.  Eat foods high in fiber, such as fresh fruits, vegetables, beans, and whole grains. Talk to your dietitian about how many servings of carbs you can eat at each meal.  Eat 4-6 ounces (oz) of lean protein each day, such as lean meat, chicken, fish, eggs, or tofu. One oz of lean protein is equal to: ? 1 oz of meat, chicken, or fish. ? 1 egg. ?  cup of tofu.  Eat some foods each day that contain healthy fats, such as avocado, nuts, seeds, and fish. Lifestyle  Check your blood glucose regularly.  Exercise regularly as told by your health care provider. This may include: ? 150 minutes of moderate-intensity or vigorous-intensity exercise each week. This could be brisk walking, biking, or water aerobics. ? Stretching and doing strength exercises, such as yoga or weightlifting, at least 2 times a week.  Take medicines as told by your health care provider.  Do not use any products that contain nicotine or tobacco, such as cigarettes and e-cigarettes. If you need help quitting, ask your health care provider.  Work with a counselor or diabetes educator to identify strategies to manage stress and any emotional and social challenges. Questions to ask a health care provider  Do I need to meet with a diabetes educator?  Do I need to meet with a dietitian?  What number can I call if I have questions?  When are the best times to check my blood glucose? Where to find more  information:  American Diabetes Association: diabetes.org  Academy of Nutrition and Dietetics: www.eatright.org  National Institute of Diabetes and Digestive and Kidney Diseases (NIH): www.niddk.nih.gov Summary  A healthy meal plan will help you control your blood glucose and maintain a healthy lifestyle.  Working with a diet and nutrition specialist (dietitian) can help you make a meal plan that is best for you.  Keep in mind that carbohydrates (carbs) and alcohol have immediate effects on your blood glucose levels. It is important to count carbs and to use alcohol carefully. This information is not intended to replace advice given to you by your health care provider. Make sure you discuss any questions you have with your health care provider. Document Revised: 04/13/2017 Document Reviewed: 06/05/2016 Elsevier Patient Education  2020 [employer] Inc. Diabetes Mellitus and Exercise Exercising regularly is important for your overall health, especially when you have diabetes (diabetes mellitus). Exercising is not only about losing weight. It has many other health benefits, such as increasing muscle strength and bone   density and reducing body fat and stress. This leads to improved fitness, flexibility, and endurance, all of which result in better overall health. Exercise has additional benefits for people with diabetes, including:  Reducing appetite.  Helping to lower and control blood glucose.  Lowering blood pressure.  Helping to control amounts of fatty substances (lipids) in the blood, such as cholesterol and triglycerides.  Helping the body to respond better to insulin (improving insulin sensitivity).  Reducing how much insulin the body needs.  Decreasing the risk for heart disease by: ? Lowering cholesterol and triglyceride levels. ? Increasing the levels of good cholesterol. ? Lowering blood glucose levels. What is my activity plan? Your health care provider or certified  diabetes educator can help you make a plan for the type and frequency of exercise (activity plan) that works for you. Make sure that you:  Do at least 150 minutes of moderate-intensity or vigorous-intensity exercise each week. This could be brisk walking, biking, or water aerobics. ? Do stretching and strength exercises, such as yoga or weightlifting, at least 2 times a week. ? Spread out your activity over at least 3 days of the week.  Get some form of physical activity every day. ? Do not go more than 2 days in a row without some kind of physical activity. ? Avoid being inactive for more than 30 minutes at a time. Take frequent breaks to walk or stretch.  Choose a type of exercise or activity that you enjoy, and set realistic goals.  Start slowly, and gradually increase the intensity of your exercise over time. What do I need to know about managing my diabetes?   Check your blood glucose before and after exercising. ? If your blood glucose is 240 mg/dL (13.3 mmol/L) or higher before you exercise, check your urine for ketones. If you have ketones in your urine, do not exercise until your blood glucose returns to normal. ? If your blood glucose is 100 mg/dL (5.6 mmol/L) or lower, eat a snack containing 15-20 grams of carbohydrate. Check your blood glucose 15 minutes after the snack to make sure that your level is above 100 mg/dL (5.6 mmol/L) before you start your exercise.  Know the symptoms of low blood glucose (hypoglycemia) and how to treat it. Your risk for hypoglycemia increases during and after exercise. Common symptoms of hypoglycemia can include: ? Hunger. ? Anxiety. ? Sweating and feeling clammy. ? Confusion. ? Dizziness or feeling light-headed. ? Increased heart rate or palpitations. ? Blurry vision. ? Tingling or numbness around the mouth, lips, or tongue. ? Tremors or shakes. ? Irritability.  Keep a rapid-acting carbohydrate snack available before, during, and after  exercise to help prevent or treat hypoglycemia.  Avoid injecting insulin into areas of the body that are going to be exercised. For example, avoid injecting insulin into: ? The arms, when playing tennis. ? The legs, when jogging.  Keep records of your exercise habits. Doing this can help you and your health care provider adjust your diabetes management plan as needed. Write down: ? Food that you eat before and after you exercise. ? Blood glucose levels before and after you exercise. ? The type and amount of exercise you have done. ? When your insulin is expected to peak, if you use insulin. Avoid exercising at times when your insulin is peaking.  When you start a new exercise or activity, work with your health care provider to make sure the activity is safe for you, and to adjust your   insulin, medicines, or food intake as needed.  Drink plenty of water while you exercise to prevent dehydration or heat stroke. Drink enough fluid to keep your urine clear or pale yellow. Summary  Exercising regularly is important for your overall health, especially when you have diabetes (diabetes mellitus).  Exercising has many health benefits, such as increasing muscle strength and bone density and reducing body fat and stress.  Your health care provider or certified diabetes educator can help you make a plan for the type and frequency of exercise (activity plan) that works for you.  When you start a new exercise or activity, work with your health care provider to make sure the activity is safe for you, and to adjust your insulin, medicines, or food intake as needed. This information is not intended to replace advice given to you by your health care provider. Make sure you discuss any questions you have with your health care provider. Document Revised: 11/23/2016 Document Reviewed: 10/11/2015 Elsevier Patient Education  2020 Elsevier Inc.  

## 2019-09-09 LAB — HM DIABETES EYE EXAM

## 2019-09-11 ENCOUNTER — Encounter: Payer: Self-pay | Admitting: Family Medicine

## 2019-09-23 ENCOUNTER — Encounter: Payer: Self-pay | Admitting: Family Medicine

## 2019-10-01 ENCOUNTER — Encounter: Payer: Self-pay | Admitting: Family Medicine

## 2019-10-14 ENCOUNTER — Ambulatory Visit: Payer: BC Managed Care – PPO | Admitting: Obstetrics & Gynecology

## 2019-10-30 NOTE — Progress Notes (Addendum)
24 y.o. G0P0000 Single White or Caucasian female here for annual exam.  Restarted OCPs 07/29/2019.  H/o irregular menstrual cycles.  She's had two cycles for the past two months.  The regular bleeding occurs with her placebo pills.  This flow lasts typically 7 days.  She is also having a mid cycle dark, old looking blood episode of bleeding as well that is mid cycle.  She is on her third pack of pills this month.    HbA1C was 11.9 09/04/2019.  Follow up is planned for July.  She is now on metformin, 1000mg , twice daily.  She is not having any GI distress.  Triglycerides were elevated with lab work 09/04/2019.  She has changed her diet significantly.  Has not seen nutritionist.  D/w pt today as well as [facility].  She would like referral.    Patient's last menstrual period was 10/27/2019 (exact date).          Sexually active: No. never sexually active The current method of family planning estrogen/progesterone pills.    Exercising: Yes.    some walking, arm weights Smoker:  no  Health Maintenance: Pap:  06-10-2018 neg History of abnormal Pap:  no TDaP:  2012 Screening Labs: just done April, 2021   reports that she has never smoked. She has never used smokeless tobacco. She reports previous alcohol use. She reports that she does not use drugs.  Past Medical History:  Diagnosis Date  . Amenorrhea 2020   treated with OCPs  . DM (diabetes mellitus) (HCC)   . Prediabetes   . Vitamin D deficiency     Past Surgical History:  Procedure Laterality Date  . WISDOM TOOTH EXTRACTION      Current Outpatient Medications  Medication Sig Dispense Refill  . metFORMIN (GLUCOPHAGE) 1000 MG tablet Take 1/2 tab twice a day for one week then increase to one tablet twice a day for diabetes 180 tablet 3  . Multiple Vitamin (MULTIVITAMIN) tablet Take 1 tablet by mouth daily.    . norethindrone-ethinyl estradiol (LOESTRIN FE) 1-20 MG-MCG tablet Take 1 tablet by mouth daily. 3 Package 1  .  Vitamin D, Ergocalciferol, (DRISDOL) 1.25 MG (50000 UNIT) CAPS capsule One capsule by mouth once a week for 12 weeks. Then take 2000IU/day 12 capsule 0   No current facility-administered medications for this visit.    Family History  Problem Relation Age of Onset  . Diabetes Father   . Miscarriages / 12-30-1998 Mother   . Breast cancer Maternal Aunt        late 40's, negative genetic testing  . Diabetes Paternal Grandfather   . Diabetes Paternal Grandmother     Review of Systems  Constitutional: Negative.   HENT: Negative.   Eyes: Negative.   Respiratory: Negative.   Cardiovascular: Negative.   Gastrointestinal: Negative.   Endocrine: Negative.   Genitourinary: Negative.   Musculoskeletal: Negative.   Skin: Negative.   Allergic/Immunologic: Negative.   Neurological: Negative.   Hematological: Negative.   Psychiatric/Behavioral: Negative.     Exam:   BP 116/64   Pulse 70   Temp 97.7 F (36.5 C) (Skin)   Resp 16   Ht 5' 4.75" (1.645 m)   Wt 219 lb (99.3 kg)   LMP 10/27/2019 (Exact Date)   BMI 36.73 kg/m   Height: 5' 4.75" (164.5 cm)  General appearance: alert, cooperative and appears stated age Head: Normocephalic, without obvious abnormality, atraumatic Neck: no adenopathy, supple, symmetrical, trachea midline and thyroid normal  to inspection and palpation Lungs: clear to auscultation bilaterally Breasts: normal appearance, no masses or tenderness Heart: regular rate and rhythm Abdomen: soft, non-tender; bowel sounds normal; no masses,  no organomegaly Extremities: extremities normal, atraumatic, no cyanosis or edema Skin: Skin color, texture, turgor normal. No rashes or lesions Lymph nodes: Cervical, supraclavicular, and axillary nodes normal. No abnormal inguinal nodes palpated Neurologic: Grossly normal   Pelvic: External genitalia:  no lesions              Urethra:  normal appearing urethra with no masses, tenderness or lesions              Bartholins  and Skenes: normal                 Vagina: normal appearing vagina, whitish vaginal discharge noted today, no lesions              Cervix: no lesions              Pap taken: No. Bimanual Exam:  Uterus:  normal size, contour, position, consistency, mobility, non-tender              Adnexa: normal adnexa and no mass, fullness, tenderness               Rectovaginal: Confirms               Anus:  normal sphincter tone, no lesions  Chaperone, Terence Lux, CMA, was present for exam.  A:  Well Woman with normal exam H/o amenorrhea/irregular cycles with  Hirsutism with normal testosterone level (Ferriman Gallway score 15 last year) Type 2 Diabetes with hbA1C 11.9 Family hx of diabetes in her father BMI 82 Family hx of breast cancer in her maternal aunt in her 85's who also had negative genetic testing White vaginal discharge  P:   Mammogram guidelines reviewed pap smear neg 2020.  Not indicated today. RF for OCPs for 3 month supply/#1 RF.  She is going to give update after next cycle.  If still irregular, will consider changing OCPs or possibly endometrial biopsy Referral to [facility] Follow with Dr. Rogers Blocker scheduled 78 OH P also obtained today as not completed last year after total testosterone was normal Affirm pending  Return annually or prn

## 2019-10-31 ENCOUNTER — Other Ambulatory Visit: Payer: Self-pay

## 2019-11-03 ENCOUNTER — Other Ambulatory Visit: Payer: Self-pay

## 2019-11-03 ENCOUNTER — Ambulatory Visit: Payer: BC Managed Care – PPO | Admitting: Obstetrics & Gynecology

## 2019-11-03 ENCOUNTER — Encounter: Payer: Self-pay | Admitting: Obstetrics & Gynecology

## 2019-11-03 VITALS — BP 116/64 | HR 70 | Temp 97.7°F | Resp 16 | Ht 64.75 in | Wt 219.0 lb

## 2019-11-03 DIAGNOSIS — N898 Other specified noninflammatory disorders of vagina: Secondary | ICD-10-CM | POA: Diagnosis not present

## 2019-11-03 DIAGNOSIS — Z01419 Encounter for gynecological examination (general) (routine) without abnormal findings: Secondary | ICD-10-CM

## 2019-11-03 DIAGNOSIS — Z6836 Body mass index (BMI) 36.0-36.9, adult: Secondary | ICD-10-CM

## 2019-11-03 DIAGNOSIS — L680 Hirsutism: Secondary | ICD-10-CM | POA: Diagnosis not present

## 2019-11-03 DIAGNOSIS — E1165 Type 2 diabetes mellitus with hyperglycemia: Secondary | ICD-10-CM

## 2019-11-03 MED ORDER — NORETHIN ACE-ETH ESTRAD-FE 1-20 MG-MCG PO TABS: 1.0000 | ORAL_TABLET | Freq: Every day | ORAL | 1 refills | Status: DC

## 2019-11-03 NOTE — Patient Instructions (Addendum)
[facility] [phone] [id number] [place]

## 2019-11-03 NOTE — Addendum Note (Signed)
Addended by: Jerene Bears on: 11/03/2019 12:42 PM   Modules accepted: Orders

## 2019-11-04 LAB — VAGINITIS/VAGINOSIS, DNA PROBE
Candida Species: NEGATIVE
Gardnerella vaginalis: NEGATIVE
Trichomonas vaginosis: NEGATIVE

## 2019-11-05 LAB — 17-HYDROXYPROGESTERONE: 17-Hydroxyprogesterone: 39 ng/dL

## 2019-11-19 ENCOUNTER — Other Ambulatory Visit: Payer: Self-pay

## 2019-11-19 ENCOUNTER — Ambulatory Visit (INDEPENDENT_AMBULATORY_CARE_PROVIDER_SITE_OTHER): Payer: BC Managed Care – PPO | Admitting: Family Medicine

## 2019-11-19 ENCOUNTER — Encounter (INDEPENDENT_AMBULATORY_CARE_PROVIDER_SITE_OTHER): Payer: Self-pay | Admitting: Family Medicine

## 2019-11-19 VITALS — BP 125/72 | HR 108 | Temp 98.6°F | Ht 65.0 in | Wt 219.0 lb

## 2019-11-19 DIAGNOSIS — Z0289 Encounter for other administrative examinations: Secondary | ICD-10-CM

## 2019-11-19 DIAGNOSIS — E1165 Type 2 diabetes mellitus with hyperglycemia: Secondary | ICD-10-CM

## 2019-11-19 DIAGNOSIS — Z9189 Other specified personal risk factors, not elsewhere classified: Secondary | ICD-10-CM | POA: Diagnosis not present

## 2019-11-19 DIAGNOSIS — R5383 Other fatigue: Secondary | ICD-10-CM

## 2019-11-19 DIAGNOSIS — R0602 Shortness of breath: Secondary | ICD-10-CM | POA: Diagnosis not present

## 2019-11-19 DIAGNOSIS — Z1331 Encounter for screening for depression: Secondary | ICD-10-CM

## 2019-11-19 DIAGNOSIS — Z6836 Body mass index (BMI) 36.0-36.9, adult: Secondary | ICD-10-CM

## 2019-11-19 DIAGNOSIS — E559 Vitamin D deficiency, unspecified: Secondary | ICD-10-CM | POA: Diagnosis not present

## 2019-11-19 MED ORDER — RYBELSUS 3 MG PO TABS: 3.0000 mg | ORAL_TABLET | Freq: Every morning | ORAL | 0 refills | Status: DC

## 2019-11-20 LAB — COMPREHENSIVE METABOLIC PANEL
ALT: 17 IU/L (ref 0–32)
AST: 13 IU/L (ref 0–40)
Albumin/Globulin Ratio: 1.3 (ref 1.2–2.2)
Albumin: 4.3 g/dL (ref 3.9–5.0)
Alkaline Phosphatase: 59 IU/L (ref 48–121)
BUN/Creatinine Ratio: 21 (ref 9–23)
BUN: 13 mg/dL (ref 6–20)
Bilirubin Total: 0.2 mg/dL (ref 0.0–1.2)
CO2: 21 mmol/L (ref 20–29)
Calcium: 9.3 mg/dL (ref 8.7–10.2)
Chloride: 99 mmol/L (ref 96–106)
Creatinine, Ser: 0.62 mg/dL (ref 0.57–1.00)
GFR calc Af Amer: 146 mL/min/{1.73_m2} (ref >59)
GFR calc non Af Amer: 127 mL/min/{1.73_m2} (ref >59)
Globulin, Total: 3.4 g/dL (ref 1.5–4.5)
Glucose: 162 mg/dL — ABNORMAL HIGH (ref 65–99)
Potassium: 4.3 mmol/L (ref 3.5–5.2)
Sodium: 136 mmol/L (ref 134–144)
Total Protein: 7.7 g/dL (ref 6.0–8.5)

## 2019-11-20 LAB — FOLATE: Folate: 7.4 ng/mL (ref >3.0)

## 2019-11-20 LAB — INSULIN, RANDOM: INSULIN: 41.4 u[IU]/mL — ABNORMAL HIGH (ref 2.6–24.9)

## 2019-11-20 LAB — VITAMIN B12: Vitamin B-12: 390 pg/mL (ref 232–1245)

## 2019-11-20 LAB — T3: T3, Total: 183 ng/dL — ABNORMAL HIGH (ref 71–180)

## 2019-11-20 LAB — T4: T4, Total: 13.8 ug/dL — ABNORMAL HIGH (ref 4.5–12.0)

## 2019-11-24 NOTE — Progress Notes (Signed)
Dear Jamie Shaggy, MD,   Thank you for referring Jamie Hughes to our clinic. The following note includes my evaluation and treatment recommendations.  Chief Complaint:   OBESITY Jamie Hughes (MR# [id number]) is a 24 y.o. female who presents for evaluation and treatment of obesity and related comorbidities. Current BMI is Body mass index is 36.44 kg/m. Jamie Hughes has been struggling with her weight for many years and has been unsuccessful in either losing weight, maintaining weight loss, or reaching her healthy weight goal.  Jamie Hughes's recent A1c was of 11.9. She notes occasionally breakfast is 2 eggs with Ezckiel toast with or without cheese, with or without apple or banana (feel satisfied). Lunch is around 12-2 pm, tuna sandwich with fruit, with or without green beans or carrots (somewhat full). Dinner is steak (6 oz pre-cooked) and pasta. No snacks after dinner.  Jamie Hughes is currently in the action stage of change and ready to dedicate time achieving and maintaining a healthier weight. Jamie Hughes is interested in becoming our patient and working on intensive lifestyle modifications including (but not limited to) diet and exercise for weight loss.  Jamie Hughes's habits were reviewed today and are as follows: Her family eats meals together, she thinks her family will eat healthier with her, her desired weight loss is 69 lbs, she has been heavy most of her life, she started gaining weight when she moved to [place] in 2011, her heaviest weight ever was 238 pounds, she is a picky eater and doesn't like to eat healthier foods, she has significant food cravings issues, she skips meals frequently, she frequently makes poor food choices, she has problems with excessive hunger, she frequently eats larger portions than normal and she struggles with emotional eating.  Depression Screen Jamie Hughes's Food and Mood (modified PHQ-9) score was 11.  Depression screen PHQ 2/9 11/19/2019  Decreased Interest  2  Down, Depressed, Hopeless 1  PHQ - 2 Score 3  Altered sleeping 2  Tired, decreased energy 1  Change in appetite 2  Feeling bad or failure about yourself  1  Trouble concentrating 2  Moving slowly or fidgety/restless 0  Suicidal thoughts 0  PHQ-9 Score 11  Difficult doing work/chores Not difficult at all   Subjective:   1. Other fatigue Jamie Hughes admits to daytime somnolence and admits to waking up still tired. Patent has a history of symptoms of daytime fatigue. Jamie Hughes generally gets 6 or 9 hours of sleep per night, and states that she has difficulty falling asleep. Snoring is present. Apneic episodes are not present. Epworth Sleepiness Score is 5. EKG-normal sinus rhythm at 96 BPM.  2. SOB (shortness of breath) on exertion Jamie Hughes notes increasing shortness of breath with exercising and seems to be worsening over time with weight gain. She notes getting out of breath sooner with activity than she used to. This has not gotten worse recently. Jamie Hughes denies shortness of breath at rest or orthopnea.  3. Type 2 diabetes mellitus with hyperglycemia, without long-term current use of insulin (HCC) Jamie Hughes's last A1c was 11.9 in April 2021. She is on metformin 1,000 mg BID.  4. Vitamin D deficiency Jamie Hughes's last Vit D level was 11.4. She is on prescription Vit D, and she notes fatigue.  5. At risk for osteoporosis Jamie Hughes is at higher risk of osteopenia and osteoporosis due to Vitamin D deficiency.   Assessment/Plan:   1. Other fatigue Jamie Hughes does feel that her weight is causing her energy to be lower than it should be.  Fatigue may be related to obesity, depression or many other causes. Labs will be ordered, and in the meanwhile, [name] will focus on self care including making healthy food choices, increasing physical activity and focusing on stress reduction.  - EKG 12-Lead - Vitamin B12 - Folate - T3 - T4  2. SOB (shortness of breath) on exertion [name] does feel that she  gets out of breath more easily that she used to when she exercises. [name]'s shortness of breath appears to be obesity related and exercise induced. She has agreed to work on weight loss and gradually increase exercise to treat her exercise induced shortness of breath. Will continue to monitor closely.  - Vitamin B12 - Folate - T3 - T4  3. Type 2 diabetes mellitus with hyperglycemia, without long-term current use of insulin (HCC) Jamie Hughes is important to decrease the likelihood of diabetic complications such as nephropathy, neuropathy, limb loss, blindness, coronary artery disease, and death. Intensive lifestyle modification including diet, exercise and weight loss are the first line of treatment for diabetes. We will check labs today. Jamie Hughes agreed to start Rybelsus 3 mg PO q AM with no refills.  - Semaglutide (RYBELSUS) 3 MG TABS; Take 3 mg by mouth in the morning.  Dispense: 30 tablet; Refill: 0 - Comprehensive metabolic panel - Insulin, random  4. Vitamin D deficiency Low Vitamin D level contributes to fatigue and are associated with obesity, breast, and colon cancer. Jamie Hughes agreed to continue taking prescription Vitamin D and will follow-up for routine testing of Vitamin D, at least 2-3 times per year to avoid over-replacement.  5. Depression screening Jamie Hughes had a positive depression screening. Depression is commonly associated with obesity and often results in emotional eating behaviors. We will monitor this closely and work on CBT to help improve the non-hunger eating patterns. Referral to Psychology may be required if no improvement is seen as she continues in our clinic.  6. At risk for osteoporosis Jamie Hughes was given approximately 15 minutes of osteoporosis prevention counseling today. Jamie Hughes is at risk for osteopenia and osteoporosis due to her Vitamin D deficiency. She was encouraged to take her Vitamin D and follow her higher calcium diet and increase  strengthening exercise to help strengthen her bones and decrease her risk of osteopenia and osteoporosis.  Repetitive spaced learning was employed today to elicit superior memory formation and behavioral change.  7. Class 2 severe obesity with serious comorbidity and body mass index (BMI) of 36.0 to 36.9 in adult, unspecified obesity type (HCC) Jamie Hughes is currently in the action stage of change and her goal is to continue with weight loss efforts. I recommend Jamie Hughes begin the structured treatment plan as follows:  She has agreed to the Category 3 Plan.  Exercise goals: No exercise has been prescribed at this time.   Behavioral modification strategies: increasing lean protein intake, increasing vegetables, meal planning and cooking strategies and keeping healthy foods in the home.  She was informed of the importance of frequent follow-up visits to maximize her success with intensive lifestyle modifications for her multiple health conditions. She was informed we would discuss her lab results at her next visit unless there is a critical issue that needs to be addressed sooner. Jamie Hughes agreed to keep her next visit at the agreed upon time to discuss these results.  Objective:   Blood pressure 125/72, pulse (!) 108, temperature 98.6 F (37 C), temperature source Oral, height 5\' 5"  (1.651 m), weight 219 lb (99.3 kg), last menstrual  period 11/19/2019, SpO2 96 %. Body mass index is 36.44 kg/m.  EKG: Normal sinus rhythm, rate 96 BPM.  Indirect Calorimeter completed today shows a VO2 of 347 and a REE of 2418.  Her calculated basal metabolic rate is 0932 thus her basal metabolic rate is better than expected.  General: Cooperative, alert, well developed, in no acute distress. HEENT: Conjunctivae and lids unremarkable. Cardiovascular: Regular rhythm.  Lungs: Normal work of breathing. Neurologic: No focal deficits.   Lab Results  Component Value Date   CREATININE 0.62 11/19/2019   BUN 13  11/19/2019   NA 136 11/19/2019   K 4.3 11/19/2019   CL 99 11/19/2019   CO2 21 11/19/2019   Lab Results  Component Value Date   ALT 17 11/19/2019   AST 13 11/19/2019   ALKPHOS 59 11/19/2019   BILITOT 0.2 11/19/2019   Lab Results  Component Value Date   HGBA1C 11.9 (H) 09/04/2019   Lab Results  Component Value Date   INSULIN 41.4 (H) 11/19/2019   Lab Results  Component Value Date   TSH 1.08 09/04/2019   Lab Results  Component Value Date   CHOL 173 09/04/2019   HDL 27.20 (L) 09/04/2019   LDLCALC 89 07/10/2018   LDLDIRECT 113.0 09/04/2019   TRIG 287.0 (H) 09/04/2019   CHOLHDL 6 09/04/2019   Lab Results  Component Value Date   WBC 7.4 09/04/2019   HGB 14.6 09/04/2019   HCT 42.4 09/04/2019   MCV 84.7 09/04/2019   PLT 307.0 09/04/2019   No results found for: IRON, TIBC, FERRITIN  Attestation Statements:   This is the patient's first visit at [facility]. The patient's NEW PATIENT PACKET was reviewed at length. Included in the packet: current and past health history, medications, allergies, ROS, gynecologic history (women only), surgical history, family history, social history, weight history, weight loss surgery history (for those that have had weight loss surgery), nutritional evaluation, mood and food questionnaire, PHQ9, Epworth questionnaire, sleep habits questionnaire, patient life and health improvement goals questionnaire. These will all be scanned into the patient's chart under media.   During the visit, I independently reviewed the patient's EKG, bioimpedance scale results, and indirect calorimeter results. I used this information to tailor a meal plan for the patient that will help her to lose weight and will improve her obesity-related conditions going forward. I performed a medically necessary appropriate examination and/or evaluation. I discussed the assessment and treatment plan with the patient. The patient was provided an opportunity to ask  questions and all were answered. The patient agreed with the plan and demonstrated an understanding of the instructions. Labs were ordered at this visit and will be reviewed at the next visit unless more critical results need to be addressed immediately. Clinical information was updated and documented in the EMR.   Time spent on visit including pre-visit chart review and post-visit care was 45 minutes.   A separate 15 minutes was spent on risk counseling (see above).   I, Burt Knack, am acting as transcriptionist for Reuben Likes, MD.  I have reviewed the above documentation for accuracy and completeness, and I agree with the above. - Katherina Mires, MD

## 2019-12-03 ENCOUNTER — Ambulatory Visit (INDEPENDENT_AMBULATORY_CARE_PROVIDER_SITE_OTHER): Payer: BC Managed Care – PPO | Admitting: Family Medicine

## 2019-12-03 ENCOUNTER — Other Ambulatory Visit: Payer: Self-pay

## 2019-12-03 ENCOUNTER — Other Ambulatory Visit (INDEPENDENT_AMBULATORY_CARE_PROVIDER_SITE_OTHER): Payer: Self-pay | Admitting: Family Medicine

## 2019-12-03 ENCOUNTER — Encounter (INDEPENDENT_AMBULATORY_CARE_PROVIDER_SITE_OTHER): Payer: Self-pay | Admitting: Family Medicine

## 2019-12-03 VITALS — BP 125/89 | HR 120 | Temp 98.7°F | Ht 65.0 in | Wt 218.0 lb

## 2019-12-03 DIAGNOSIS — E1165 Type 2 diabetes mellitus with hyperglycemia: Secondary | ICD-10-CM

## 2019-12-03 DIAGNOSIS — E559 Vitamin D deficiency, unspecified: Secondary | ICD-10-CM

## 2019-12-03 DIAGNOSIS — R7401 Elevation of levels of liver transaminase levels: Secondary | ICD-10-CM | POA: Diagnosis not present

## 2019-12-03 DIAGNOSIS — R7989 Other specified abnormal findings of blood chemistry: Secondary | ICD-10-CM | POA: Diagnosis not present

## 2019-12-03 DIAGNOSIS — E7849 Other hyperlipidemia: Secondary | ICD-10-CM

## 2019-12-03 DIAGNOSIS — Z9189 Other specified personal risk factors, not elsewhere classified: Secondary | ICD-10-CM | POA: Diagnosis not present

## 2019-12-03 DIAGNOSIS — Z6836 Body mass index (BMI) 36.0-36.9, adult: Secondary | ICD-10-CM

## 2019-12-03 MED ORDER — VITAMIN D (ERGOCALCIFEROL) 1.25 MG (50000 UNIT) PO CAPS: 50000.0000 [IU] | ORAL_CAPSULE | ORAL | 0 refills | Status: DC

## 2019-12-03 MED ORDER — METFORMIN HCL 1000 MG PO TABS: 1000.0000 mg | ORAL_TABLET | Freq: Two times a day (BID) | ORAL | 0 refills | Status: DC

## 2019-12-04 ENCOUNTER — Encounter (INDEPENDENT_AMBULATORY_CARE_PROVIDER_SITE_OTHER): Payer: Self-pay | Admitting: Family Medicine

## 2019-12-04 NOTE — Telephone Encounter (Signed)
Please advise 

## 2019-12-04 NOTE — Progress Notes (Signed)
Chief Complaint:   OBESITY Jamie Hughes is here to discuss her progress with her obesity treatment plan along with follow-up of her obesity related diagnoses. Jamie Hughes is on the Category 3 Plan and states she is following her eating plan approximately 40% of the time. Jamie Hughes states she is doing curls, walking, and running for 10-30 minutes 2 times per week.  Today's visit was #: 2 Starting weight: 219 lbs Starting date: 11/19/2019 Today's weight: 218 lbs Today's date: 12/03/2019 Total lbs lost to date: 1 Total lbs lost since last in-office visit: 1  Interim History: Jamie Hughes's first week and a half she was able to stick to breakfast and dinner portions. She felt when she followed the plan she was extremely full and couldn't eat everything. She is picking up more groceries today.  Subjective:   1. Type 2 diabetes mellitus with hyperglycemia, without long-term current use of insulin (HCC) [name] hasn't started Rybelsus yet. Last A1c was 11.9 and insulin 41.4. She had I episode of feeling hypoglycemic and needing to eat peaches. Her father has diabetes. I discussed labs with the patient today.  2. Abnormal thyroid blood test Jamie Hughes has a new diagnosis of abnormal thyroid blood test. Last TSH was 1.08, but her T3 was 183 and T4 13.8. She denies cold/heat intolerance or palpitations, but she notes fatigue. I discussed labs with the patient today.  3. Other hyperlipidemia Levie's last LDL was 113, HDL 27.2, and triglycerides 287. She is not on statin. I discussed labs with the patient today.  4. Transaminitis Jamie Hughes's CMP was within normal limits on her draw at her first appointment, but previously elevated. She denies a history of non-alcoholic fatty liver disease. I discussed labs with the patient today.  5. Vitamin D deficiency Jamie Hughes's last Vit D was 11.4. She denies nausea, vomiting, or muscle weakness, but she notes fatigue. She is on prescription Vit D, but only has 1 left. I  discussed labs with the patient today.  6. At risk for osteoporosis Jamie Hughes is at higher risk of osteopenia and osteoporosis due to Vitamin D deficiency.   Assessment/Plan:   1. Type 2 diabetes mellitus with hyperglycemia, without long-term current use of insulin (HCC) Good blood sugar control is important to decrease the likelihood of diabetic complications such as nephropathy, neuropathy, limb loss, blindness, coronary artery disease, and death. Intensive lifestyle modification including diet, exercise and weight loss are the first line of treatment for diabetes. Jamie Hughes agreed to start Rybelsus and she will continue metformin, and we will refill metformin for 1 month.  - metFORMIN (GLUCOPHAGE) 1000 MG tablet; Take 1 tablet (1,000 mg total) by mouth 2 (two) times daily with a meal. Take 1 tab twice a day for diabetes  Dispense: 60 tablet; Refill: 0  2. Abnormal thyroid blood test We will refer to Endocrinology for evaluation. Jamie Hughes will continue to follow up as directed.  - Ambulatory referral to Endocrinology  3. Other hyperlipidemia Cardiovascular risk and specific lipid/LDL goals reviewed. We discussed several lifestyle modifications today and [name] will continue to work on diet, exercise and weight loss efforts. We will repeat labs in 3 months. Orders and follow up as documented in patient record.   Counseling Intensive lifestyle modifications are the first line treatment for this issue. . Dietary changes: Increase soluble fiber. Decrease simple carbohydrates. . Exercise changes: Moderate to vigorous-intensity aerobic activity 150 minutes per week if tolerated. . Lipid-lowering medications: see documented in medical record.  4. Transaminitis We will send a referral  for an ultrasound if LFTs are increased at her next lab draw. Konner will continue to follow up as directed.  5. Vitamin D deficiency Low Vitamin D level contributes to fatigue and are associated with obesity,  breast, and colon cancer. Jamie Hughes agreed to start prescription Vitamin D 50,000 IU every week with no refills. She will follow-up for routine testing of Vitamin D, at least 2-3 times per year to avoid over-replacement. We will repeat labs in 2 months.  - Vitamin D, Ergocalciferol, (DRISDOL) 1.25 MG (50000 UNIT) CAPS capsule; Take 1 capsule (50,000 Units total) by mouth every 7 (seven) days.  Dispense: 4 capsule; Refill: 0  6. At risk for osteoporosis Jamie Hughes was given approximately 30 minutes of osteoporosis prevention counseling today. Jamie Hughes is at risk for osteopenia and osteoporosis due to her Vitamin D deficiency. She was encouraged to take her Vitamin D and follow her higher calcium diet and increase strengthening exercise to help strengthen her bones and decrease her risk of osteopenia and osteoporosis.  Repetitive spaced learning was employed today to elicit superior memory formation and behavioral change.  7. Class 2 severe obesity with serious comorbidity and body mass index (BMI) of 36.0 to 36.9 in adult, unspecified obesity type (HCC) Jamie Hughes is currently in the action stage of change. As such, her goal is to continue with weight loss efforts. She has agreed to the Category 3 Plan.   Exercise goals: No exercise has been prescribed at this time.  Behavioral modification strategies: increasing lean protein intake, increasing vegetables, meal planning and cooking strategies, keeping healthy foods in the home and planning for success.  Jamie Hughes has agreed to follow-up with our clinic in 2 weeks. She was informed of the importance of frequent follow-up visits to maximize her success with intensive lifestyle modifications for her multiple health conditions.   Objective:   Blood pressure 125/89, pulse (!) 120, temperature 98.7 F (37.1 C), temperature source Oral, height 5\' 5"  (1.651 m), weight 218 lb (98.9 kg), last menstrual period 11/29/2019, SpO2 93 %. Body mass index is 36.28  kg/m.  General: Cooperative, alert, well developed, in no acute distress. HEENT: Conjunctivae and lids unremarkable. Cardiovascular: Regular rhythm.  Lungs: Normal work of breathing. Neurologic: No focal deficits.   Lab Results  Component Value Date   CREATININE 0.62 11/19/2019   BUN 13 11/19/2019   NA 136 11/19/2019   K 4.3 11/19/2019   CL 99 11/19/2019   CO2 21 11/19/2019   Lab Results  Component Value Date   ALT 17 11/19/2019   AST 13 11/19/2019   ALKPHOS 59 11/19/2019   BILITOT 0.2 11/19/2019   Lab Results  Component Value Date   HGBA1C 11.9 (H) 09/04/2019   Lab Results  Component Value Date   INSULIN 41.4 (H) 11/19/2019   Lab Results  Component Value Date   TSH 1.08 09/04/2019   Lab Results  Component Value Date   CHOL 173 09/04/2019   HDL 27.20 (L) 09/04/2019   LDLCALC 89 07/10/2018   LDLDIRECT 113.0 09/04/2019   TRIG 287.0 (H) 09/04/2019   CHOLHDL 6 09/04/2019   Lab Results  Component Value Date   WBC 7.4 09/04/2019   HGB 14.6 09/04/2019   HCT 42.4 09/04/2019   MCV 84.7 09/04/2019   PLT 307.0 09/04/2019   No results found for: IRON, TIBC, FERRITIN  Attestation Statements:   Reviewed by clinician on day of visit: allergies, medications, problem list, medical history, surgical history, family history, social history, and previous encounter notes.  I, Burt Knack, am acting as transcriptionist for Reuben Likes, MD.  I have reviewed the above documentation for accuracy and completeness, and I agree with the above. - Katherina Mires, MD

## 2019-12-08 ENCOUNTER — Ambulatory Visit: Payer: BC Managed Care – PPO | Admitting: Family Medicine

## 2019-12-25 ENCOUNTER — Ambulatory Visit (INDEPENDENT_AMBULATORY_CARE_PROVIDER_SITE_OTHER): Payer: BC Managed Care – PPO | Admitting: Family Medicine

## 2020-01-15 ENCOUNTER — Ambulatory Visit: Payer: BC Managed Care – PPO | Admitting: Family Medicine

## 2020-01-20 ENCOUNTER — Ambulatory Visit (INDEPENDENT_AMBULATORY_CARE_PROVIDER_SITE_OTHER): Payer: BC Managed Care – PPO | Admitting: Family Medicine

## 2020-02-04 ENCOUNTER — Ambulatory Visit: Payer: BC Managed Care – PPO | Admitting: Endocrinology

## 2020-02-09 ENCOUNTER — Ambulatory Visit (INDEPENDENT_AMBULATORY_CARE_PROVIDER_SITE_OTHER): Payer: BC Managed Care – PPO | Admitting: Family Medicine

## 2020-02-09 ENCOUNTER — Encounter: Payer: Self-pay | Admitting: Endocrinology

## 2020-02-09 ENCOUNTER — Other Ambulatory Visit: Payer: Self-pay

## 2020-02-09 ENCOUNTER — Encounter (INDEPENDENT_AMBULATORY_CARE_PROVIDER_SITE_OTHER): Payer: Self-pay | Admitting: Family Medicine

## 2020-02-09 VITALS — BP 130/91 | HR 114 | Temp 98.6°F | Ht 65.0 in | Wt 224.0 lb

## 2020-02-09 DIAGNOSIS — E559 Vitamin D deficiency, unspecified: Secondary | ICD-10-CM | POA: Diagnosis not present

## 2020-02-09 DIAGNOSIS — E1165 Type 2 diabetes mellitus with hyperglycemia: Secondary | ICD-10-CM | POA: Diagnosis not present

## 2020-02-09 DIAGNOSIS — F418 Other specified anxiety disorders: Secondary | ICD-10-CM | POA: Diagnosis not present

## 2020-02-09 DIAGNOSIS — Z9189 Other specified personal risk factors, not elsewhere classified: Secondary | ICD-10-CM

## 2020-02-09 DIAGNOSIS — Z6837 Body mass index (BMI) 37.0-37.9, adult: Secondary | ICD-10-CM

## 2020-02-09 MED ORDER — VITAMIN D (ERGOCALCIFEROL) 1.25 MG (50000 UNIT) PO CAPS: 50000.0000 [IU] | ORAL_CAPSULE | ORAL | 0 refills | Status: DC

## 2020-02-10 NOTE — Progress Notes (Signed)
Chief Complaint:   OBESITY Jamie Hughes is here to discuss her progress with her obesity treatment plan along with follow-up of her obesity related diagnoses. Jamie Hughes is on the Category 3 Plan and states she is following her eating plan approximately 98% of the time. Jamie Hughes states she is swimming for 30-60 minutes 1 time per week.  Today's visit was #: 3 Starting weight: 219 lbs Starting date: 11/19/2019 Today's weight: 224 lbs Today's date: 02/09/2020 Total lbs lost to date: 0 Total lbs lost since last in-office visit: 0  Interim History: Jamie Hughes was last seen in July. She experienced significant work stress and voices she experienced a mental breakdown. She stopped all her medications during that time. She has restarted her medications now and gotten back on the plan. She denies obstacles in the upcoming weeks. She wants to get out more.  Subjective:   1. Type 2 diabetes mellitus with hyperglycemia, without long-term current use of insulin (HCC) Jamie Hughes is on metformin and Rybelsus. She denies GI side effects or Rybelsus or metformin.  2. Vitamin D deficiency Jamie Hughes denies nausea, vomiting, or muscle weakness, but she notes fatigue. She is on prescription Vit D. Last Vit D level was 11.4.  3. Depression with anxiety Jamie Hughes is under considerable stress. She denies suicidal or homicidal ideas. She recently stopped all her medications due to increase in stress.  4. At risk for osteoporosis Jamie Hughes is at higher risk of osteopenia and osteoporosis due to Vitamin D deficiency.   Assessment/Plan:   1. Type 2 diabetes mellitus with hyperglycemia, without long-term current use of insulin (HCC) Good blood sugar control is important to decrease the likelihood of diabetic complications such as nephropathy, neuropathy, limb loss, blindness, coronary artery disease, and death. Intensive lifestyle modification including diet, exercise and weight loss are the first line of treatment for  diabetes. Jamie Hughes will continue both medications, no change in dose. We will repeat labs in 1 month.  2. Vitamin D deficiency Low Vitamin D level contributes to fatigue and are associated with obesity, breast, and colon cancer. We will refill prescription Vitamin D for 1 month. Jamie Hughes will follow-up for routine testing of Vitamin D, at least 2-3 times per year to avoid over-replacement.  - Vitamin D, Ergocalciferol, (DRISDOL) 1.25 MG (50000 UNIT) CAPS capsule; Take 1 capsule (50,000 Units total) by mouth every 7 (seven) days.  Dispense: 4 capsule; Refill: 0  3. Depression with anxiety Behavior modification techniques were discussed today to help Jamie Hughes deal with her anxiety and depression. We will follow up at her next appointment. She would like to defer medications at this time. She will MyChart message if she needs to discuss prior to her next appointment. Orders and follow up as documented in patient record.   4. At risk for osteoporosis Jamie Hughes was given approximately 15 minutes of osteoporosis prevention counseling today. Jamie Hughes is at risk for osteopenia and osteoporosis due to her Vitamin D deficiency. She was encouraged to take her Vitamin D and follow her higher calcium diet and increase strengthening exercise to help strengthen her bones and decrease her risk of osteopenia and osteoporosis.  Repetitive spaced learning was employed today to elicit superior memory formation and behavioral change.  5. Class 2 severe obesity with serious comorbidity and body mass index (BMI) of 37.0 to 37.9 in adult, unspecified obesity type (HCC) Jamie Hughes is currently in the action stage of change. As such, her goal is to continue with weight loss efforts. She has agreed to the Category  3 Plan.   Exercise goals: No exercise has been prescribed at this time.  Behavioral modification strategies: increasing lean protein intake, increasing vegetables, meal planning and cooking strategies, keeping healthy  foods in the home and planning for success.  Jamie Hughes has agreed to follow-up with our clinic in 2 weeks. She was informed of the importance of frequent follow-up visits to maximize her success with intensive lifestyle modifications for her multiple health conditions.   Objective:   Blood pressure (!) 130/91, pulse (!) 114, temperature 98.6 F (37 C), temperature source Oral, height 5\' 5"  (1.651 m), weight 224 lb (101.6 kg), last menstrual period 01/20/2020, SpO2 94 %. Body mass index is 37.28 kg/m.  General: Cooperative, alert, well developed, in no acute distress. HEENT: Conjunctivae and lids unremarkable. Cardiovascular: Regular rhythm.  Lungs: Normal work of breathing. Neurologic: No focal deficits.   Lab Results  Component Value Date   CREATININE 0.62 11/19/2019   BUN 13 11/19/2019   NA 136 11/19/2019   K 4.3 11/19/2019   CL 99 11/19/2019   CO2 21 11/19/2019   Lab Results  Component Value Date   ALT 17 11/19/2019   AST 13 11/19/2019   ALKPHOS 59 11/19/2019   BILITOT 0.2 11/19/2019   Lab Results  Component Value Date   HGBA1C 11.9 (H) 09/04/2019   Lab Results  Component Value Date   INSULIN 41.4 (H) 11/19/2019   Lab Results  Component Value Date   TSH 1.08 09/04/2019   Lab Results  Component Value Date   CHOL 173 09/04/2019   HDL 27.20 (L) 09/04/2019   LDLCALC 89 07/10/2018   LDLDIRECT 113.0 09/04/2019   TRIG 287.0 (H) 09/04/2019   CHOLHDL 6 09/04/2019   Lab Results  Component Value Date   WBC 7.4 09/04/2019   HGB 14.6 09/04/2019   HCT 42.4 09/04/2019   MCV 84.7 09/04/2019   PLT 307.0 09/04/2019   No results found for: IRON, TIBC, FERRITIN  Attestation Statements:   Reviewed by clinician on day of visit: allergies, medications, problem list, medical history, surgical history, family history, social history, and previous encounter notes.   I, 09/06/2019, am acting as transcriptionist for Burt Knack, MD. I have reviewed the above  documentation for accuracy and completeness, and I agree with the above. - Jamie Likes, MD

## 2020-02-24 ENCOUNTER — Ambulatory Visit (INDEPENDENT_AMBULATORY_CARE_PROVIDER_SITE_OTHER): Payer: BC Managed Care – PPO | Admitting: Family Medicine

## 2020-03-06 ENCOUNTER — Encounter: Payer: Self-pay | Admitting: Obstetrics & Gynecology

## 2020-03-07 ENCOUNTER — Other Ambulatory Visit: Payer: Self-pay | Admitting: Obstetrics & Gynecology

## 2020-03-07 MED ORDER — NORETHIN ACE-ETH ESTRAD-FE 1-20 MG-MCG PO TABS: 1.0000 | ORAL_TABLET | Freq: Every day | ORAL | 3 refills | Status: DC

## 2020-03-10 ENCOUNTER — Ambulatory Visit: Payer: Self-pay | Admitting: Family Medicine

## 2020-03-18 ENCOUNTER — Ambulatory Visit (INDEPENDENT_AMBULATORY_CARE_PROVIDER_SITE_OTHER): Payer: BC Managed Care – PPO | Admitting: Family Medicine

## 2020-03-29 ENCOUNTER — Other Ambulatory Visit: Payer: Self-pay

## 2020-03-29 ENCOUNTER — Ambulatory Visit (INDEPENDENT_AMBULATORY_CARE_PROVIDER_SITE_OTHER): Payer: BC Managed Care – PPO | Admitting: Family Medicine

## 2020-03-29 ENCOUNTER — Encounter (INDEPENDENT_AMBULATORY_CARE_PROVIDER_SITE_OTHER): Payer: Self-pay | Admitting: Family Medicine

## 2020-03-29 VITALS — BP 125/91 | HR 102 | Temp 98.1°F | Ht 65.0 in | Wt 225.0 lb

## 2020-03-29 DIAGNOSIS — Z9189 Other specified personal risk factors, not elsewhere classified: Secondary | ICD-10-CM

## 2020-03-29 DIAGNOSIS — E1165 Type 2 diabetes mellitus with hyperglycemia: Secondary | ICD-10-CM | POA: Diagnosis not present

## 2020-03-29 DIAGNOSIS — Z6837 Body mass index (BMI) 37.0-37.9, adult: Secondary | ICD-10-CM

## 2020-03-29 DIAGNOSIS — E559 Vitamin D deficiency, unspecified: Secondary | ICD-10-CM | POA: Diagnosis not present

## 2020-03-31 ENCOUNTER — Ambulatory Visit: Payer: BC Managed Care – PPO | Admitting: Family Medicine

## 2020-03-31 DIAGNOSIS — Z0289 Encounter for other administrative examinations: Secondary | ICD-10-CM

## 2020-04-05 NOTE — Progress Notes (Signed)
Chief Complaint:   OBESITY Jamie Hughes is here to discuss her progress with her obesity treatment plan along with follow-up of her obesity related diagnoses. Jamie Hughes is on the Category 2 Plan and states she is following her eating plan approximately 70% of the time. Jamie Hughes states she is walking for 30 minutes 1 time per week.  Today's visit was #: 4 Starting weight: 219 lbs Starting date: 11/19/2019 Today's weight: 225 lbs Today's date: 03/29/2020 Total lbs lost to date: 0 Total lbs lost since last in-office visit: 0  Interim History: Jamie Hughes is trying to stick as close to the plan as possible. She is having significant work stress. She has taking medications as prescribed. She has worked herself to getting back on the Category 3 plan. She is thinking about going to [place] for the holidays.  Subjective:   1. Vitamin D deficiency Jamie Hughes denies nausea, vomiting, or muscle weakness, but she notes fatigue. She is on prescription Vit D.  2. Type 2 diabetes mellitus with hyperglycemia, without long-term current use of insulin (HCC) Jamie Hughes is on metformin daily and Rybelsus. She notes some GI side effects with increased dose of metformin.  3. At risk for side effect of medication Jamie Hughes is at risk for drug side effects due to increased dose of metformin.  Assessment/Plan:   1. Vitamin D deficiency Low Vitamin D level contributes to fatigue and are associated with obesity, breast, and colon cancer. We will refill prescription Vitamin D for 1 month. Jamie Hughes will follow-up for routine testing of Vitamin D, at least 2-3 times per year to avoid over-replacement.  - Vitamin D, Ergocalciferol, (DRISDOL) 1.25 MG (50000 UNIT) CAPS capsule; Take 1 capsule (50,000 Units total) by mouth every 7 (seven) days.  Dispense: 4 capsule; Refill: 0  2. Type 2 diabetes mellitus with hyperglycemia, without long-term current use of insulin (HCC) Good blood sugar control is important to decrease the  likelihood of diabetic complications such as nephropathy, neuropathy, limb loss, blindness, coronary artery disease, and death. Intensive lifestyle modification including diet, exercise and weight loss are the first line of treatment for diabetes. Kaniyah agreed to increase metformin as tolerated, and we will reassess dose of Rybelsus at her next appointment.  3. At risk for side effect of medication Jamie Hughes was given approximately 15 minutes of drug side effect counseling today.  We discussed side effect possibility and risk versus benefits. Jamie Hughes agreed to the medication and will contact this office if these side effects are intolerable.  Repetitive spaced learning was employed today to elicit superior memory formation and behavioral change.  4. Class 2 severe obesity with serious comorbidity and body mass index (BMI) of 37.0 to 37.9 in adult, unspecified obesity type (HCC) Jamie Hughes is currently in the action stage of change. As such, her goal is to continue with weight loss efforts. She has agreed to the Category 3 Plan.   Exercise goals: All adults should avoid inactivity. Some physical activity is better than none, and adults who participate in any amount of physical activity gain some health benefits.  Behavioral modification strategies: increasing lean protein intake, meal planning and cooking strategies, keeping healthy foods in the home and holiday eating strategies .  Jamie Hughes has agreed to follow-up with our clinic in 3 weeks. She was informed of the importance of frequent follow-up visits to maximize her success with intensive lifestyle modifications for her multiple health conditions.   Objective:   Blood pressure (!) 125/91, pulse (!) 102, temperature 98.1 F (36.7  C), temperature source Oral, height 5\' 5"  (1.651 m), weight 225 lb (102.1 kg), last menstrual period 03/20/2020, SpO2 96 %. Body mass index is 37.44 kg/m.  General: Cooperative, alert, well developed, in no acute  distress. HEENT: Conjunctivae and lids unremarkable. Cardiovascular: Regular rhythm.  Lungs: Normal work of breathing. Neurologic: No focal deficits.   Lab Results  Component Value Date   CREATININE 0.62 11/19/2019   BUN 13 11/19/2019   NA 136 11/19/2019   K 4.3 11/19/2019   CL 99 11/19/2019   CO2 21 11/19/2019   Lab Results  Component Value Date   ALT 17 11/19/2019   AST 13 11/19/2019   ALKPHOS 59 11/19/2019   BILITOT 0.2 11/19/2019   Lab Results  Component Value Date   HGBA1C 11.9 (H) 09/04/2019   Lab Results  Component Value Date   INSULIN 41.4 (H) 11/19/2019   Lab Results  Component Value Date   TSH 1.08 09/04/2019   Lab Results  Component Value Date   CHOL 173 09/04/2019   HDL 27.20 (L) 09/04/2019   LDLCALC 89 07/10/2018   LDLDIRECT 113.0 09/04/2019   TRIG 287.0 (H) 09/04/2019   CHOLHDL 6 09/04/2019   Lab Results  Component Value Date   WBC 7.4 09/04/2019   HGB 14.6 09/04/2019   HCT 42.4 09/04/2019   MCV 84.7 09/04/2019   PLT 307.0 09/04/2019   No results found for: IRON, TIBC, FERRITIN  Attestation Statements:   Reviewed by clinician on day of visit: allergies, medications, problem list, medical history, surgical history, family history, social history, and previous encounter notes.   I, 09/06/2019, am acting as transcriptionist for Burt Knack, MD.  I have reviewed the above documentation for accuracy and completeness, and I agree with the above. - Reuben Likes, MD

## 2020-04-13 MED ORDER — VITAMIN D (ERGOCALCIFEROL) 1.25 MG (50000 UNIT) PO CAPS: 50000.0000 [IU] | ORAL_CAPSULE | ORAL | 0 refills | Status: DC

## 2020-04-20 ENCOUNTER — Encounter (INDEPENDENT_AMBULATORY_CARE_PROVIDER_SITE_OTHER): Payer: Self-pay

## 2020-04-21 ENCOUNTER — Encounter: Payer: Self-pay | Admitting: Family Medicine

## 2020-04-21 ENCOUNTER — Other Ambulatory Visit: Payer: Self-pay

## 2020-04-21 ENCOUNTER — Ambulatory Visit: Payer: BC Managed Care – PPO | Admitting: Family Medicine

## 2020-04-21 VITALS — BP 124/94 | HR 105 | Temp 97.6°F | Ht 65.0 in | Wt 228.4 lb

## 2020-04-21 DIAGNOSIS — E559 Vitamin D deficiency, unspecified: Secondary | ICD-10-CM | POA: Diagnosis not present

## 2020-04-21 DIAGNOSIS — R7989 Other specified abnormal findings of blood chemistry: Secondary | ICD-10-CM | POA: Diagnosis not present

## 2020-04-21 DIAGNOSIS — E1165 Type 2 diabetes mellitus with hyperglycemia: Secondary | ICD-10-CM

## 2020-04-21 DIAGNOSIS — Z1159 Encounter for screening for other viral diseases: Secondary | ICD-10-CM | POA: Diagnosis not present

## 2020-04-21 LAB — POCT GLYCOSYLATED HEMOGLOBIN (HGB A1C): Hemoglobin A1C: 9.6 % — AB (ref 4.0–5.6)

## 2020-04-21 MED ORDER — RYBELSUS 7 MG PO TABS: 7.0000 mg | ORAL_TABLET | Freq: Every day | ORAL | 1 refills | Status: DC

## 2020-04-21 NOTE — Patient Instructions (Addendum)
So we are increasing your rybelsus to 7mg /day. I sent in new px. Continue metformin daily.  -really work on exercise and diet.  With diabetes I recommend you get pneumovax 23 vaccine. Will need this once.  Will do foot exam at next visit. Important you check the bottom of your feet weekly   -see you back in 3 months time. I still wonder if you need insulin and it would be cheaper for you as well.   -checking vitamin D as well. And thyroid labs again   -want your bottom blood pressure number 80 or less. We will need to watch this.   Good to see you! Merry christmas! Dr. 

## 2020-04-21 NOTE — Progress Notes (Signed)
Patient: Jamie Hughes MRN: [id number] DOB: Jun 10, 1995 PCP: Orland Mustard, MD     Subjective:  Chief Complaint  Patient presents with  . Diabetes  . abnormal thyroid labs  . vitamin D deficiency    HPI: The patient is a 24 y.o. female who presents today for DM. She has not followed up like she should, but has gotten [facility]. She was stressed and stopped her medication and stopped exercising.   Diabetes: Patient is here for follow up of type 2 diabetes. First diagnosed 08/2019.  Currently on the following medications metformin 1000mg  Bid and rybelsus 3mg /day.  She has had no issues taking this. It was 300 dollars and she is paying out of pocket. Takes medications as prescribed. Last A1C was 9.6. Currently exercising and following diabetic diet. Denies any hypoglycemic events. Denies any vision changes, nausea, vomiting, abdominal pain, ulcers/paraesthesia in feet, polyuria, polydipsia or polyphagia. Denies any chest pain, shortness of breath. She did have her eyes checked and it was normal.   Vitamin D deficiency -last checked in April of 2021. Was 11.44. she states she is still on 50,000IU/weekly.   Abnormal thyroid labs TSh was normal. Elevated T4/T3. checking again today. Done at weight loss clinic.    Review of Systems  Constitutional: Negative for chills, fatigue and fever.  HENT: Negative for dental problem, ear pain, hearing loss and trouble swallowing.   Eyes: Negative for visual disturbance.  Respiratory: Negative for cough, chest tightness and shortness of breath.   Cardiovascular: Negative for chest pain, palpitations and leg swelling.  Gastrointestinal: Negative for abdominal pain, blood in stool, diarrhea and nausea.  Endocrine: Negative for cold intolerance, polydipsia, polyphagia and polyuria.  Genitourinary: Negative for dysuria, frequency, hematuria and urgency.  Musculoskeletal: Negative for arthralgias.  Skin: Negative for  rash.  Neurological: Negative for dizziness and headaches.  Psychiatric/Behavioral: Negative for dysphoric mood and sleep disturbance. The patient is not nervous/anxious.     Allergies Patient has No Known Allergies.  Past Medical History Patient  has a past medical history of Amenorrhea (2020), Back pain, DM (diabetes mellitus) (HCC), Joint pain, Overweight, Prediabetes, and Vitamin D deficiency.  Surgical History Patient  has a past surgical history that includes Wisdom tooth extraction.  Family History Pateint's family history includes Breast cancer in her maternal aunt; Diabetes in her father, paternal grandfather, and paternal grandmother; Miscarriages / 03-05-1987 in her mother; Obesity in her father.  Social History Patient  reports that she has never smoked. She has never used smokeless tobacco. She reports previous alcohol use. She reports that she does not use drugs.    Objective: Vitals:   04/21/20 1406 04/21/20 1441  BP: (!) 136/100 (!) 124/94  Pulse: (!) 105   Temp: 97.6 F (36.4 C)   TempSrc: Temporal   SpO2: 96%   Weight: 228 lb 6.4 oz (103.6 kg)   Height: 5\' 5"  (1.651 m)     Body mass index is 38.01 kg/m.  Physical Exam Vitals reviewed.  Constitutional:      Appearance: Normal appearance. She is obese.  HENT:     Head: Normocephalic and atraumatic.  Cardiovascular:     Rate and Rhythm: Normal rate and regular rhythm.     Heart sounds: Normal heart sounds.  Pulmonary:     Effort: Pulmonary effort is normal.     Breath sounds: Normal breath sounds.  Abdominal:     General: Bowel sounds are normal.     Palpations: Abdomen is  soft.  Musculoskeletal:     Cervical back: Normal range of motion and neck supple.  Skin:    Comments: hirsutism on chin/chest.   Neurological:     General: No focal deficit present.     Mental Status: She is alert and oriented to person, place, and time.  Psychiatric:        Mood and Affect: Mood normal.        Behavior:  Behavior normal.    A1c: 9.6    Assessment/plan: 1. Type 2 diabetes mellitus with hyperglycemia, without long-term current use of insulin (HCC) -small improvement in a1c.  -increasing rybelsus to 7mg /day, continue metformin. Discussed I still think insulin would be the best choice and cheapest option as she is paying for the rybelsus out of pocket.  -checking type 1 diabetes labs as well with other routine lab work -discussed statin therapy -eye exam done -discussed pneumovax vaccine. She will look into this -risks of uncontrolled diabetes discussed. Really want her to get serious about exercise. Working hard on diet.  -f/u in 3 months and discussed importance of compliance. Foot exam at next visit.  - POCT glycosylated hemoglobin (Hb A1C) - Microalbumin / creatinine urine ratio; Future - CBC with Differential/Platelet; Future - COMPLETE METABOLIC PANEL WITH GFR; Future - GAD65, IA-2, and Insulin Autoantibody serum; Future  2. Abnormal thyroid blood test  - T3, free; Future - T4, free; Future - TSH; Future  3. Encounter for hepatitis C screening test for low risk patient  - Hepatitis C antibody; Future  4. Vitamin D deficiency  - VITAMIN D 25 Hydroxy (Vit-D Deficiency, Fractures); Future   -elevated blood pressure. continue to monitor diastolic.   This visit occurred during the SARS-CoV-2 public health emergency.  Safety protocols were in place, including screening questions prior to the visit, additional usage of staff PPE, and extensive cleaning of exam room while observing appropriate contact time as indicated for disinfecting solutions.     Return in about 3 months (around 07/20/2020) for diabetes/foot exam. .   09/19/2020, MD [facility]   04/21/2020

## 2020-04-22 ENCOUNTER — Ambulatory Visit (INDEPENDENT_AMBULATORY_CARE_PROVIDER_SITE_OTHER): Payer: BC Managed Care – PPO | Admitting: Family Medicine

## 2020-04-27 ENCOUNTER — Ambulatory Visit (INDEPENDENT_AMBULATORY_CARE_PROVIDER_SITE_OTHER): Payer: BC Managed Care – PPO | Admitting: Family Medicine

## 2020-04-29 LAB — MICROALBUMIN / CREATININE URINE RATIO
Creatinine, Urine: 17 mg/dL — ABNORMAL LOW (ref 20–275)
Microalb, Ur: <0.2 mg/dL

## 2020-04-29 LAB — CBC WITH DIFFERENTIAL/PLATELET
Absolute Monocytes: 491 cells/uL (ref 200–950)
Basophils Absolute: 78 cells/uL (ref 0–200)
Basophils Relative: 1 %
Eosinophils Absolute: 250 cells/uL (ref 15–500)
Eosinophils Relative: 3.2 %
HCT: 40.1 % (ref 35.0–45.0)
Hemoglobin: 13 g/dL (ref 11.7–15.5)
Lymphs Abs: 2519 cells/uL (ref 850–3900)
MCH: 28.3 pg (ref 27.0–33.0)
MCHC: 32.4 g/dL (ref 32.0–36.0)
MCV: 87.2 fL (ref 80.0–100.0)
MPV: 10 fL (ref 7.5–12.5)
Monocytes Relative: 6.3 %
Neutro Abs: 4462 cells/uL (ref 1500–7800)
Neutrophils Relative %: 57.2 %
Platelets: 346 10*3/uL (ref 140–400)
RBC: 4.6 10*6/uL (ref 3.80–5.10)
RDW: 12.1 % (ref 11.0–15.0)
Total Lymphocyte: 32.3 %
WBC: 7.8 10*3/uL (ref 3.8–10.8)

## 2020-04-29 LAB — COMPLETE METABOLIC PANEL WITH GFR
AG Ratio: 1.3 (calc) (ref 1.0–2.5)
ALT: 32 U/L — ABNORMAL HIGH (ref 6–29)
AST: 38 U/L — ABNORMAL HIGH (ref 10–30)
Albumin: 4.2 g/dL (ref 3.6–5.1)
Alkaline phosphatase (APISO): 44 U/L (ref 31–125)
BUN: 11 mg/dL (ref 7–25)
CO2: 22 mmol/L (ref 20–32)
Calcium: 9.2 mg/dL (ref 8.6–10.2)
Chloride: 102 mmol/L (ref 98–110)
Creat: 0.6 mg/dL (ref 0.50–1.10)
GFR, Est African American: 147 mL/min/{1.73_m2} (ref >=60)
GFR, Est Non African American: 127 mL/min/{1.73_m2} (ref >=60)
Globulin: 3.2 g/dL (calc) (ref 1.9–3.7)
Glucose, Bld: 246 mg/dL — ABNORMAL HIGH (ref 65–99)
Potassium: 4.7 mmol/L (ref 3.5–5.3)
Sodium: 132 mmol/L — ABNORMAL LOW (ref 135–146)
Total Bilirubin: 0.3 mg/dL (ref 0.2–1.2)
Total Protein: 7.4 g/dL (ref 6.1–8.1)

## 2020-04-29 LAB — GAD65, IA-2, AND INSULIN AUTOANTIBODY SERUM
Glutamic Acid Decarb Ab: <5 IU/mL (ref <5)
IA-2 Antibody: <5.4 U/mL (ref <5.4)
Insulin Antibodies, Human: <0.4 U/mL (ref <0.4)

## 2020-04-29 LAB — T4, FREE: Free T4: 1.4 ng/dL (ref 0.8–1.8)

## 2020-04-29 LAB — HEPATITIS C ANTIBODY
Hepatitis C Ab: NONREACTIVE
SIGNAL TO CUT-OFF: 0.01 (ref <1.00)

## 2020-04-29 LAB — T3, FREE: T3, Free: 3.3 pg/mL (ref 2.3–4.2)

## 2020-04-29 LAB — VITAMIN D 25 HYDROXY (VIT D DEFICIENCY, FRACTURES): Vit D, 25-Hydroxy: 44 ng/mL (ref 30–100)

## 2020-04-29 LAB — TSH: TSH: 1.48 mIU/L

## 2020-05-18 ENCOUNTER — Encounter (INDEPENDENT_AMBULATORY_CARE_PROVIDER_SITE_OTHER): Payer: Self-pay | Admitting: Family Medicine

## 2020-05-18 ENCOUNTER — Other Ambulatory Visit: Payer: Self-pay

## 2020-05-18 ENCOUNTER — Telehealth (INDEPENDENT_AMBULATORY_CARE_PROVIDER_SITE_OTHER): Payer: BLUE CROSS/BLUE SHIELD | Admitting: Family Medicine

## 2020-05-18 ENCOUNTER — Telehealth (INDEPENDENT_AMBULATORY_CARE_PROVIDER_SITE_OTHER): Payer: Self-pay

## 2020-05-18 DIAGNOSIS — Z6837 Body mass index (BMI) 37.0-37.9, adult: Secondary | ICD-10-CM | POA: Diagnosis not present

## 2020-05-18 DIAGNOSIS — R7401 Elevation of levels of liver transaminase levels: Secondary | ICD-10-CM

## 2020-05-18 DIAGNOSIS — E1165 Type 2 diabetes mellitus with hyperglycemia: Secondary | ICD-10-CM

## 2020-05-18 NOTE — Telephone Encounter (Signed)
I connected with  Jamie Hughes on 05/18/20 by a video enabled telemedicine application and verified that I am speaking with the correct person using two identifiers.   I discussed the limitations of evaluation and management by telemedicine. The patient expressed understanding and agreed to proceed.

## 2020-05-19 NOTE — Progress Notes (Signed)
TeleHealth Visit:  Due to the COVID-19 pandemic, this visit was completed with telemedicine (audio/video) technology to reduce patient and provider exposure as well as to preserve personal protective equipment.   Jamie Hughes has verbally consented to this TeleHealth visit. The patient is located at home, the provider is located at the [facility] office. The participants in this visit include the listed provider and patient. The visit was conducted today via MyChart video.  Chief Complaint: OBESITY Jamie Hughes is here to discuss her progress with her obesity treatment plan along with follow-up of her obesity related diagnoses. Jamie Hughes is on the Category 3 Plan and states she is following her eating plan approximately 95% of the time. Jamie Hughes states she is not exercising regularly.  Today's visit was #: 5 Starting weight: 219 lbs Starting date: 11/19/2019  Interim History: Jamie Hughes says things went well over the holidays.  She is still working on settling things at work.  She reports that her weight is 226.5 pounds.  She is getting back on her medications for diabetes.  Her goal is to start exercising more.  Over the next few weeks, she has work and house hunting.  She is getting in 8-10 ounces at dinner.  Subjective:   1. Type 2 diabetes mellitus with hyperglycemia, without long-term current use of insulin (HCC) Jamie Hughes is taking Rybelsus 7 mg daily and metformin 1,000 mg twice daily.  She says she has minimal GI discomfort.  Lab Results  Component Value Date   HGBA1C 9.6 (A) 04/21/2020   HGBA1C 11.9 (H) 09/04/2019   Lab Results  Component Value Date   MICROALBUR <0.2 04/21/2020   LDLCALC 89 07/10/2018   CREATININE 0.60 04/21/2020   Lab Results  Component Value Date   INSULIN 41.4 (H) 11/19/2019   2. Transaminitis Last LFTs slightly higher than last labs.  Hepatitis C negative.  Assessment/Plan:   1. Type 2 diabetes mellitus with hyperglycemia, without long-term  current use of insulin (HCC) Good blood sugar control is important to decrease the likelihood of diabetic complications such as nephropathy, neuropathy, limb loss, blindness, coronary artery disease, and death. Intensive lifestyle modification including diet, exercise and weight loss are the first line of treatment for diabetes.  She is to continue her current medications with no change in dose.  2. Transaminitis Follow-up LFTs in 3 months.  3. Class 2 severe obesity with serious comorbidity and body mass index (BMI) of 37.0 to 37.9 in adult, unspecified obesity type (HCC)  Jamie Hughes is currently in the action stage of change. As such, her goal is to continue with weight loss efforts. She has agreed to the Category 3 Plan.   Exercise goals: All adults should avoid inactivity. Some physical activity is better than none, and adults who participate in any amount of physical activity gain some health benefits. She is going to start swimming.  Behavioral modification strategies: increasing lean protein intake, meal planning and cooking strategies, keeping healthy foods in the home and planning for success.  Jamie Hughes has agreed to follow-up with our clinic in 2 weeks. She was informed of the importance of frequent follow-up visits to maximize her success with intensive lifestyle modifications for her multiple health conditions.  Objective:   VITALS: Per patient if applicable, see vitals. GENERAL: Alert and in no acute distress. CARDIOPULMONARY: No increased WOB. Speaking in clear sentences.  PSYCH: Pleasant and cooperative. Speech normal rate and rhythm. Affect is appropriate. Insight and judgement are appropriate. Attention is focused, linear, and appropriate.  NEURO: Oriented as arrived to appointment on time with no prompting.   Lab Results  Component Value Date   CREATININE 0.60 04/21/2020   BUN 11 04/21/2020   NA 132 (L) 04/21/2020   K 4.7 04/21/2020   CL 102 04/21/2020   CO2 22 04/21/2020    Lab Results  Component Value Date   ALT 32 (H) 04/21/2020   AST 38 (H) 04/21/2020   ALKPHOS 59 11/19/2019   BILITOT 0.3 04/21/2020   Lab Results  Component Value Date   HGBA1C 9.6 (A) 04/21/2020   HGBA1C 11.9 (H) 09/04/2019   Lab Results  Component Value Date   INSULIN 41.4 (H) 11/19/2019   Lab Results  Component Value Date   TSH 1.48 04/21/2020   Lab Results  Component Value Date   CHOL 173 09/04/2019   HDL 27.20 (L) 09/04/2019   LDLCALC 89 07/10/2018   LDLDIRECT 113.0 09/04/2019   TRIG 287.0 (H) 09/04/2019   CHOLHDL 6 09/04/2019   Lab Results  Component Value Date   WBC 7.8 04/21/2020   HGB 13.0 04/21/2020   HCT 40.1 04/21/2020   MCV 87.2 04/21/2020   PLT 346 04/21/2020   Attestation Statements:   Reviewed by clinician on day of visit: allergies, medications, problem list, medical history, surgical history, family history, social history, and previous encounter notes.  I, Insurance claims handler, CMA, am acting as transcriptionist for Reuben Likes, MD. I have reviewed the above documentation for accuracy and completeness, and I agree with the above. - Katherina Mires, MD

## 2020-06-03 ENCOUNTER — Telehealth (INDEPENDENT_AMBULATORY_CARE_PROVIDER_SITE_OTHER): Payer: BLUE CROSS/BLUE SHIELD | Admitting: Family Medicine

## 2020-06-03 ENCOUNTER — Encounter (INDEPENDENT_AMBULATORY_CARE_PROVIDER_SITE_OTHER): Payer: Self-pay | Admitting: Family Medicine

## 2020-06-03 ENCOUNTER — Other Ambulatory Visit: Payer: Self-pay

## 2020-06-03 DIAGNOSIS — Z6837 Body mass index (BMI) 37.0-37.9, adult: Secondary | ICD-10-CM | POA: Diagnosis not present

## 2020-06-03 DIAGNOSIS — E559 Vitamin D deficiency, unspecified: Secondary | ICD-10-CM

## 2020-06-03 DIAGNOSIS — E1165 Type 2 diabetes mellitus with hyperglycemia: Secondary | ICD-10-CM | POA: Diagnosis not present

## 2020-06-08 NOTE — Progress Notes (Signed)
TeleHealth Visit:  Due to the COVID-19 pandemic, this visit was completed with telemedicine (audio/video) technology to reduce patient and provider exposure as well as to preserve personal protective equipment.   Emera has verbally consented to this TeleHealth visit. The patient is located at home, the provider is located at the [facility] office. The participants in this visit include the listed provider and patient.The visit was conducted today via MyChart Video.   Chief Complaint: OBESITY Caroll is here to discuss her progress with her obesity treatment plan along with follow-up of her obesity related diagnoses. Espyn is on the Category 3 Plan and states she is following her eating plan approximately 90-95% of the time. Meeya states she is not exercising.  Today's visit was #: 6 Starting weight: 219 lbs Starting date: 11/19/2019  Interim History: Weight of 224.6 today. Rahima has followed plan almost completely except for the substitute of [employer] milk. She denies hunger. Dejai does well with portion at dinner. She is finding herself craving sugar less and less. Dominque is doing cheese and yogurt as snack options. She is looking for other options for lunch.  Subjective:   1. Vitamin D deficiency Donda is on prescription Vitamin D. She denies nausea, vomiting, or muscle weakness, but notes fatigue.  2. Type 2 diabetes mellitus with hyperglycemia, without long-term current use of insulin (HCC) Medications reviewed. Diabetic ROS: no polyuria or polydipsia, no chest pain, dyspnea or TIA's, no numbness, tingling or pain in extremities.Kaena is on metformin and Rybelsus. Occasionally nausea is worse in the morning.   Lab Results  Component Value Date   HGBA1C 9.6 (A) 04/21/2020   HGBA1C 11.9 (H) 09/04/2019   Lab Results  Component Value Date   MICROALBUR <0.2 04/21/2020   LDLCALC 89 07/10/2018   CREATININE 0.60 04/21/2020   Lab Results  Component  Value Date   INSULIN 41.4 (H) 11/19/2019     Assessment/Plan:   1. Vitamin D deficiency Low Vitamin D level contributes to fatigue and are associated with obesity, breast, and colon cancer. She agrees to continue to take prescription Vitamin D @50 ,000 IU every week and will follow-up for routine testing of Vitamin D, at least 2-3 times per year to avoid over-replacement. Deatra will continue to take prescription Vitamin D. No refiil needed.   2. Type 2 diabetes mellitus with hyperglycemia, without long-term current use of insulin (HCC) Good blood sugar control is important to decrease the likelihood of diabetic complications such as nephropathy, neuropathy, limb loss, blindness, coronary artery disease, and death. Intensive lifestyle modification including diet, exercise and weight loss are the first line of treatment for diabetes. Ameria will continue both medications with no changes in dose.  3. Class 2 severe obesity with serious comorbidity and body mass index (BMI) of 37.0 to 37.9 in adult, unspecified obesity type (HCC)  Shontae is currently in the action stage of change. As such, her goal is to continue with weight loss efforts. She has agreed to the Category 3 Plan.   Exercise goals: No exercise has been prescribed at this time.  Behavioral modification strategies: increasing lean protein intake, meal planning and cooking strategies and keeping healthy foods in the home.  Teal has agreed to follow-up with our clinic in 2 weeks. She was informed of the importance of frequent follow-up visits to maximize her success with intensive lifestyle modifications for her multiple health conditions.   Objective:   VITALS: Per patient if applicable, see vitals. GENERAL: Alert and in  no acute distress. CARDIOPULMONARY: No increased WOB. Speaking in clear sentences.  PSYCH: Pleasant and cooperative. Speech normal rate and rhythm. Affect is appropriate. Insight and judgement are  appropriate. Attention is focused, linear, and appropriate.  NEURO: Oriented as arrived to appointment on time with no prompting.   Lab Results  Component Value Date   CREATININE 0.60 04/21/2020   BUN 11 04/21/2020   NA 132 (L) 04/21/2020   K 4.7 04/21/2020   CL 102 04/21/2020   CO2 22 04/21/2020   Lab Results  Component Value Date   ALT 32 (H) 04/21/2020   AST 38 (H) 04/21/2020   ALKPHOS 59 11/19/2019   BILITOT 0.3 04/21/2020   Lab Results  Component Value Date   HGBA1C 9.6 (A) 04/21/2020   HGBA1C 11.9 (H) 09/04/2019   Lab Results  Component Value Date   INSULIN 41.4 (H) 11/19/2019   Lab Results  Component Value Date   TSH 1.48 04/21/2020   Lab Results  Component Value Date   CHOL 173 09/04/2019   HDL 27.20 (L) 09/04/2019   LDLCALC 89 07/10/2018   LDLDIRECT 113.0 09/04/2019   TRIG 287.0 (H) 09/04/2019   CHOLHDL 6 09/04/2019   Lab Results  Component Value Date   WBC 7.8 04/21/2020   HGB 13.0 04/21/2020   HCT 40.1 04/21/2020   MCV 87.2 04/21/2020   PLT 346 04/21/2020   No results found for: IRON, TIBC, FERRITIN  Attestation Statements:   Reviewed by clinician on day of visit: allergies, medications, problem list, medical history, surgical history, family history, social history, and previous encounter notes.    I, Delorse Limber, am acting as transcriptionist for Reuben Likes, MD. I have reviewed the above documentation for accuracy and completeness, and I agree with the above. - Katherina Mires, MD

## 2020-06-22 ENCOUNTER — Other Ambulatory Visit: Payer: Self-pay

## 2020-06-22 ENCOUNTER — Ambulatory Visit (INDEPENDENT_AMBULATORY_CARE_PROVIDER_SITE_OTHER): Payer: BLUE CROSS/BLUE SHIELD | Admitting: Family Medicine

## 2020-06-22 ENCOUNTER — Encounter (INDEPENDENT_AMBULATORY_CARE_PROVIDER_SITE_OTHER): Payer: Self-pay | Admitting: Family Medicine

## 2020-06-22 VITALS — BP 128/88 | HR 110 | Temp 98.2°F | Ht 65.0 in | Wt 219.0 lb

## 2020-06-22 DIAGNOSIS — R7401 Elevation of levels of liver transaminase levels: Secondary | ICD-10-CM

## 2020-06-22 DIAGNOSIS — E1165 Type 2 diabetes mellitus with hyperglycemia: Secondary | ICD-10-CM

## 2020-06-22 DIAGNOSIS — Z6836 Body mass index (BMI) 36.0-36.9, adult: Secondary | ICD-10-CM | POA: Diagnosis not present

## 2020-06-22 DIAGNOSIS — Z9189 Other specified personal risk factors, not elsewhere classified: Secondary | ICD-10-CM

## 2020-06-22 MED ORDER — RYBELSUS 7 MG PO TABS: 7.0000 mg | ORAL_TABLET | Freq: Every day | ORAL | 1 refills | Status: DC

## 2020-06-24 NOTE — Progress Notes (Signed)
Chief Complaint:   OBESITY Jamie Hughes is here to discuss her progress with her obesity treatment plan along with follow-up of her obesity related diagnoses. Jamie Hughes is on the Category 3 Plan and states she is following her eating plan approximately 100% of the time. Jamie Hughes states she is doing Oculus 20-30 minutes 3 times per week.  Today's visit was #: 7 Starting weight: 219 lbs Starting date: 11/19/2019 Today's weight: 219 lbs Today's date: 06/22/2020 Total lbs lost to date: 0 Total lbs lost since last in-office visit: 6 lbs  Interim History: Jamie Hughes is doing well following the plan with the exception of yesterday, as she had cheesecake. She is feeling fuller and is almost able to get all food in daily. She voices that she isn't able to get everything down in the morning.  Subjective:   1. Type 2 diabetes mellitus with hyperglycemia, without long-term current use of insulin (HCC) Pt is on Metformin and Rybelsus. She denies GI side effects of either. She reports no feelings of hypoglycemia.   Lab Results  Component Value Date   HGBA1C 9.6 (A) 04/21/2020   HGBA1C 11.9 (H) 09/04/2019   Lab Results  Component Value Date   MICROALBUR <0.2 04/21/2020   LDLCALC 89 07/10/2018   CREATININE 0.60 04/21/2020   Lab Results  Component Value Date   INSULIN 41.4 (H) 11/19/2019    2. Transaminitis Last LFTs resulted AST 38 and ALT 32. Pt has no ultrasound on file.   Lab Results  Component Value Date   ALT 32 (H) 04/21/2020   AST 38 (H) 04/21/2020   ALKPHOS 59 11/19/2019   BILITOT 0.3 04/21/2020    3. At risk for nausea Jamie Hughes is at risk for nausea due to being given Rybelsus.  Assessment/Plan:   1. Type 2 diabetes mellitus with hyperglycemia, without long-term current use of insulin (HCC) Good blood sugar control is important to decrease the likelihood of diabetic complications such as nephropathy, neuropathy, limb loss, blindness, coronary artery disease, and death. Intensive  lifestyle modification including diet, exercise and weight loss are the first line of treatment for diabetes.  - Semaglutide (RYBELSUS) 7 MG TABS; Take 7 mg by mouth daily.  Dispense: 30 tablet; Refill: 1  2. Transaminitis Repeat CMP in 2 months.  3. At risk for nausea Jamie Hughes was given approximately 15 minutes of nausea prevention counseling today. Jamie Hughes is at risk for nausea due to her new or current medication. She was encouraged to titrate her medication slowly, make sure to stay hydrated, eat smaller portions throughout the day, and avoid high fat meals.   4. Class 2 severe obesity with serious comorbidity and body mass index (BMI) of 36.0 to 36.9 in adult, unspecified obesity type (HCC) Jamie Hughes is currently in the action stage of change. As such, her goal is to continue with weight loss efforts. She has agreed to the Category 3 Plan.   Jamie Hughes wants to continue to try lunch options of meal plan.  Exercise goals: As is  Behavioral modification strategies: increasing lean protein intake, meal planning and cooking strategies, keeping healthy foods in the home and planning for success.  Jamie Hughes has agreed to follow-up with our clinic in 2 weeks. She was informed of the importance of frequent follow-up visits to maximize her success with intensive lifestyle modifications for her multiple health conditions.   Objective:   Blood pressure 128/88, pulse (!) 110, temperature 98.2 F (36.8 C), temperature source Oral, height 5\' 5"  (1.651 m),  weight 219 lb (99.3 kg), last menstrual period 06/21/2020, SpO2 99 %. Body mass index is 36.44 kg/m.  General: Cooperative, alert, well developed, in no acute distress. HEENT: Conjunctivae and lids unremarkable. Cardiovascular: Regular rhythm.  Lungs: Normal work of breathing. Neurologic: No focal deficits.   Lab Results  Component Value Date   CREATININE 0.60 04/21/2020   BUN 11 04/21/2020   NA 132 (L) 04/21/2020   K 4.7  04/21/2020   CL 102 04/21/2020   CO2 22 04/21/2020   Lab Results  Component Value Date   ALT 32 (H) 04/21/2020   AST 38 (H) 04/21/2020   ALKPHOS 59 11/19/2019   BILITOT 0.3 04/21/2020   Lab Results  Component Value Date   HGBA1C 9.6 (A) 04/21/2020   HGBA1C 11.9 (H) 09/04/2019   Lab Results  Component Value Date   INSULIN 41.4 (H) 11/19/2019   Lab Results  Component Value Date   TSH 1.48 04/21/2020   Lab Results  Component Value Date   CHOL 173 09/04/2019   HDL 27.20 (L) 09/04/2019   LDLCALC 89 07/10/2018   LDLDIRECT 113.0 09/04/2019   TRIG 287.0 (H) 09/04/2019   CHOLHDL 6 09/04/2019   Lab Results  Component Value Date   WBC 7.8 04/21/2020   HGB 13.0 04/21/2020   HCT 40.1 04/21/2020   MCV 87.2 04/21/2020   PLT 346 04/21/2020    Attestation Statements:   Reviewed by clinician on day of visit: allergies, medications, problem list, medical history, surgical history, family history, social history, and previous encounter notes.  Edmund Hilda, am acting as transcriptionist for Reuben Likes, MD.   I have reviewed the above documentation for accuracy and completeness, and I agree with the above. - Katherina Mires, MD

## 2020-07-08 ENCOUNTER — Encounter (INDEPENDENT_AMBULATORY_CARE_PROVIDER_SITE_OTHER): Payer: Self-pay | Admitting: Family Medicine

## 2020-07-08 ENCOUNTER — Other Ambulatory Visit: Payer: Self-pay

## 2020-07-08 ENCOUNTER — Telehealth (INDEPENDENT_AMBULATORY_CARE_PROVIDER_SITE_OTHER): Payer: BLUE CROSS/BLUE SHIELD | Admitting: Family Medicine

## 2020-07-08 DIAGNOSIS — Z6836 Body mass index (BMI) 36.0-36.9, adult: Secondary | ICD-10-CM | POA: Diagnosis not present

## 2020-07-08 DIAGNOSIS — E1165 Type 2 diabetes mellitus with hyperglycemia: Secondary | ICD-10-CM | POA: Diagnosis not present

## 2020-07-12 ENCOUNTER — Encounter (INDEPENDENT_AMBULATORY_CARE_PROVIDER_SITE_OTHER): Payer: Self-pay | Admitting: Family Medicine

## 2020-07-12 DIAGNOSIS — Z6836 Body mass index (BMI) 36.0-36.9, adult: Secondary | ICD-10-CM | POA: Insufficient documentation

## 2020-07-12 NOTE — Progress Notes (Signed)
TeleHealth Visit:  Due to the COVID-19 pandemic, this visit was completed with telemedicine (audio/video) technology to reduce patient and provider exposure as well as to preserve personal protective equipment.   Jamie Hughes has verbally consented to this TeleHealth visit. The patient is located at home, the provider is located at the [facility] office. The participants in this visit include the listed provider and patient. The visit was conducted today via video.   Chief Complaint: OBESITY Jamie Hughes is here to discuss her progress with her obesity treatment plan along with follow-up of her obesity related diagnoses. Jamie Hughes is on the Category 3 Plan and states she is following her eating plan approximately 85-90% of the time. Lailee states she is doing Oculus 20-30 minutes 3 times per week.  Today's visit was #: 8 Starting weight: 219 lbs Starting date: 11/19/2019  Interim History: This is my first visit with Jamie Hughes. She started our program November 19, 2019 at 219 lbs. She weighed 219 at her last in office visit (06/22/20) reflecting weight maintenance.    Jamie Hughes notes that she is getting in all 3 meals most days. She admits to being a snacker. However, she has cut back on snacking and eating out. She likes both sweet and salty. She feels that lack of meal prep is the biggest issue that keeps her from sticking to the plan. She also does not like to cook. . . She and her sister are trying some meal prep services. She denies drinking sugar sweetened beverages. She drinks water  Subjective:   1. Type 2 diabetes mellitus with hyperglycemia, without long-term current use of insulin (HCC) Jamie Hughes's diabetes is not well controlled. Her last A1c was 9.6. She does not check her CBG's at home. She admits to having a very hard time with sticks/injections. She denies nausea, hypoglycemia. She does note constipation. She is on Metformin and Rybelsus.   Lab Results  Component Value Date    HGBA1C 9.6 (A) 04/21/2020   HGBA1C 11.9 (H) 09/04/2019   Lab Results  Component Value Date   MICROALBUR <0.2 04/21/2020   LDLCALC 89 07/10/2018   CREATININE 0.60 04/21/2020   Lab Results  Component Value Date   INSULIN 41.4 (H) 11/19/2019    Assessment/Plan:   1. Type 2 diabetes mellitus with hyperglycemia, without long-term current use of insulin (HCC)  Continue Rybelsus and Metformin. Work on reducing simple carbohydrates.  2. Class 2 severe obesity with serious comorbidity and body mass index (BMI) of 36.0 to 36.9 in adult, unspecified obesity type (HCC) Jamie Hughes is currently in the action stage of change. As such, her goal is to continue with weight loss efforts. She has agreed to the Category 3 Plan.   Exercise goals: As is  Behavioral modification strategies: increasing lean protein intake and decreasing simple carbohydrates. Continue to check into meal prep services with low carb options.   Jamie Hughes has agreed to follow-up with our clinic in 2-3 weeks.   Objective:   VITALS: Per patient if applicable, see vitals. GENERAL: Alert and in no acute distress. CARDIOPULMONARY: No increased WOB. Speaking in clear sentences.  PSYCH: Pleasant and cooperative. Speech normal rate and rhythm. Affect is appropriate. Insight and judgement are appropriate. Attention is focused, linear, and appropriate.  NEURO: Oriented as arrived to appointment on time with no prompting.   Lab Results  Component Value Date   CREATININE 0.60 04/21/2020   BUN 11 04/21/2020   NA 132 (L) 04/21/2020   K 4.7 04/21/2020  CL 102 04/21/2020   CO2 22 04/21/2020   Lab Results  Component Value Date   ALT 32 (H) 04/21/2020   AST 38 (H) 04/21/2020   ALKPHOS 59 11/19/2019   BILITOT 0.3 04/21/2020   Lab Results  Component Value Date   HGBA1C 9.6 (A) 04/21/2020   HGBA1C 11.9 (H) 09/04/2019   Lab Results  Component Value Date   INSULIN 41.4 (H) 11/19/2019   Lab Results  Component Value Date   TSH  1.48 04/21/2020   Lab Results  Component Value Date   CHOL 173 09/04/2019   HDL 27.20 (L) 09/04/2019   LDLCALC 89 07/10/2018   LDLDIRECT 113.0 09/04/2019   TRIG 287.0 (H) 09/04/2019   CHOLHDL 6 09/04/2019   Lab Results  Component Value Date   WBC 7.8 04/21/2020   HGB 13.0 04/21/2020   HCT 40.1 04/21/2020   MCV 87.2 04/21/2020   PLT 346 04/21/2020    Attestation Statements:   Reviewed by clinician on day of visit: allergies, medications, problem list, medical history, surgical history, family history, social history, and previous encounter notes.  Edmund Hilda, am acting as Energy manager for [place], FNP.  I have reviewed the above documentation for accuracy and completeness, and I agree with the above. - Jesse Sans, FNP

## 2020-08-02 ENCOUNTER — Ambulatory Visit (INDEPENDENT_AMBULATORY_CARE_PROVIDER_SITE_OTHER): Payer: Self-pay | Admitting: Family Medicine

## 2020-08-10 ENCOUNTER — Other Ambulatory Visit: Payer: Self-pay

## 2020-08-10 ENCOUNTER — Ambulatory Visit (INDEPENDENT_AMBULATORY_CARE_PROVIDER_SITE_OTHER): Payer: BLUE CROSS/BLUE SHIELD | Admitting: Family Medicine

## 2020-08-10 ENCOUNTER — Encounter (INDEPENDENT_AMBULATORY_CARE_PROVIDER_SITE_OTHER): Payer: Self-pay | Admitting: Family Medicine

## 2020-08-10 VITALS — BP 124/84 | HR 97 | Temp 98.3°F | Ht 65.0 in | Wt 222.0 lb

## 2020-08-10 DIAGNOSIS — F3289 Other specified depressive episodes: Secondary | ICD-10-CM | POA: Diagnosis not present

## 2020-08-10 DIAGNOSIS — Z9189 Other specified personal risk factors, not elsewhere classified: Secondary | ICD-10-CM

## 2020-08-10 DIAGNOSIS — E1165 Type 2 diabetes mellitus with hyperglycemia: Secondary | ICD-10-CM | POA: Diagnosis not present

## 2020-08-10 DIAGNOSIS — Z6836 Body mass index (BMI) 36.0-36.9, adult: Secondary | ICD-10-CM

## 2020-08-10 MED ORDER — SERTRALINE HCL 25 MG PO TABS: 25.0000 mg | ORAL_TABLET | Freq: Every day | ORAL | 0 refills | Status: AC

## 2020-08-10 MED ORDER — RYBELSUS 7 MG PO TABS: 7.0000 mg | ORAL_TABLET | Freq: Every day | ORAL | 0 refills | Status: DC

## 2020-08-17 NOTE — Progress Notes (Signed)
Chief Complaint:   OBESITY Jamie Hughes is here to discuss her progress with her obesity treatment plan along with follow-up of her obesity related diagnoses. Jamie Hughes is on the Category 3 Plan and states she is following her eating plan approximately 80% of the time. Jamie Hughes states she is walking 3-4 hours 2-3 times per week.  Today's visit was #: 9 Starting weight: 219 lbs Starting date: 11/19/2019 Today's weight: 222 lbs Today's date: 08/10/2020 Total lbs lost to date: 0 Total lbs lost since last in-office visit: 0  Interim History: Jamie Hughes was sticking closely around her house recently and voices she's been struggling with depression. She wasn't taking her medication as regularly. She has been exercising more. She is struggling with protein intake as her refrigerator is not consistently working.  Subjective:   1. Type 2 diabetes mellitus with hyperglycemia, without long-term current use of insulin (HCC) Jamie Hughes is on Rybelsus and Metformin. She denies GI side effects.   Lab Results  Component Value Date   HGBA1C 9.6 (A) 04/21/2020   HGBA1C 11.9 (H) 09/04/2019   Lab Results  Component Value Date   MICROALBUR <0.2 04/21/2020   LDLCALC 89 07/10/2018   CREATININE 0.60 04/21/2020   Lab Results  Component Value Date   INSULIN 41.4 (H) 11/19/2019    2. Other depression Jamie Hughes has no history of taking medication. She denies suicidal or homicidal ideations. Pt voices she has struggled with anhedonia, decreased energy, and increased sleep.  3. At risk for side effect of medication Jamie Hughes is at risk for side effects of medication due to starting an anti-depressant.  Assessment/Plan:   1. Type 2 diabetes mellitus with hyperglycemia, without long-term current use of insulin (HCC) Good blood sugar control is important to decrease the likelihood of diabetic complications such as nephropathy, neuropathy, limb loss, blindness, coronary artery disease, and death. Intensive lifestyle  modification including diet, exercise and weight loss are the first line of treatment for diabetes.   - Semaglutide (RYBELSUS) 7 MG TABS; Take 7 mg by mouth daily.  Dispense: 30 tablet; Refill: 0  2. Other depression Behavior modification techniques were discussed today to help Jamie Hughes deal with her emotional/non-hunger eating behaviors.  Orders and follow up as documented in patient record. Start sertraline 25 mg, as per below.  - sertraline (ZOLOFT) 25 MG tablet; Take 1 tablet (25 mg total) by mouth daily.  Dispense: 30 tablet; Refill: 0  3. At risk for side effect of medication Jamie Hughes was given approximately 15 minutes of drug side effect counseling today.  We discussed side effect possibility and risk versus benefits. Jamie Hughes agreed to the medication and will contact this office if these side effects are intolerable.  Repetitive spaced learning was employed today to elicit superior memory formation and behavioral change.  4. Class 2 severe obesity with serious comorbidity and body mass index (BMI) of 36.0 to 36.9 in adult, unspecified obesity type (HCC) Jamie Hughes is currently in the action stage of change. As such, her goal is to continue with weight loss efforts. She has agreed to the Category 3 Plan and keeping a food journal and adhering to recommended goals of 250-400 calories and 30+ g protein.   Exercise goals: All adults should avoid inactivity. Some physical activity is better than none, and adults who participate in any amount of physical activity gain some health benefits.  Behavioral modification strategies: increasing lean protein intake, meal planning and cooking strategies, keeping healthy foods in the home and planning for success.  Jamie Hughes has agreed to follow-up with our clinic in 2-3 weeks. She was informed of the importance of frequent follow-up visits to maximize her success with intensive lifestyle modifications for her multiple health conditions.   Objective:   Blood  pressure 124/84, pulse 97, temperature 98.3 F (36.8 C), temperature source Oral, height 5\' 5"  (1.651 m), weight 222 lb (100.7 kg), SpO2 98 %. Body mass index is 36.94 kg/m.  General: Cooperative, alert, well developed, in no acute distress. HEENT: Conjunctivae and lids unremarkable. Cardiovascular: Regular rhythm.  Lungs: Normal work of breathing. Neurologic: No focal deficits.   Lab Results  Component Value Date   CREATININE 0.60 04/21/2020   BUN 11 04/21/2020   NA 132 (L) 04/21/2020   K 4.7 04/21/2020   CL 102 04/21/2020   CO2 22 04/21/2020   Lab Results  Component Value Date   ALT 32 (H) 04/21/2020   AST 38 (H) 04/21/2020   ALKPHOS 59 11/19/2019   BILITOT 0.3 04/21/2020   Lab Results  Component Value Date   HGBA1C 9.6 (A) 04/21/2020   HGBA1C 11.9 (H) 09/04/2019   Lab Results  Component Value Date   INSULIN 41.4 (H) 11/19/2019   Lab Results  Component Value Date   TSH 1.48 04/21/2020   Lab Results  Component Value Date   CHOL 173 09/04/2019   HDL 27.20 (L) 09/04/2019   LDLCALC 89 07/10/2018   LDLDIRECT 113.0 09/04/2019   TRIG 287.0 (H) 09/04/2019   CHOLHDL 6 09/04/2019   Lab Results  Component Value Date   WBC 7.8 04/21/2020   HGB 13.0 04/21/2020   HCT 40.1 04/21/2020   MCV 87.2 04/21/2020   PLT 346 04/21/2020    Attestation Statements:   Reviewed by clinician on day of visit: allergies, medications, problem list, medical history, surgical history, family history, social history, and previous encounter notes.  14/12/2019, am acting as transcriptionist for Edmund Hilda, MD.   I have reviewed the above documentation for accuracy and completeness, and I agree with the above. - Jamie Likes, MD

## 2020-08-30 ENCOUNTER — Encounter (INDEPENDENT_AMBULATORY_CARE_PROVIDER_SITE_OTHER): Payer: Self-pay

## 2020-08-30 DIAGNOSIS — E119 Type 2 diabetes mellitus without complications: Secondary | ICD-10-CM | POA: Diagnosis not present

## 2020-08-31 ENCOUNTER — Ambulatory Visit (INDEPENDENT_AMBULATORY_CARE_PROVIDER_SITE_OTHER): Payer: BLUE CROSS/BLUE SHIELD | Admitting: Physician Assistant

## 2020-09-06 ENCOUNTER — Encounter: Payer: Self-pay | Admitting: Family Medicine

## 2020-09-06 ENCOUNTER — Other Ambulatory Visit: Payer: Self-pay

## 2020-09-06 ENCOUNTER — Ambulatory Visit (INDEPENDENT_AMBULATORY_CARE_PROVIDER_SITE_OTHER): Payer: BLUE CROSS/BLUE SHIELD | Admitting: Family Medicine

## 2020-09-06 VITALS — BP 126/84 | HR 91 | Temp 98.6°F | Ht 65.0 in | Wt 223.6 lb

## 2020-09-06 DIAGNOSIS — F419 Anxiety disorder, unspecified: Secondary | ICD-10-CM

## 2020-09-06 DIAGNOSIS — E1165 Type 2 diabetes mellitus with hyperglycemia: Secondary | ICD-10-CM

## 2020-09-06 DIAGNOSIS — Z0000 Encounter for general adult medical examination without abnormal findings: Secondary | ICD-10-CM | POA: Diagnosis not present

## 2020-09-06 DIAGNOSIS — Z114 Encounter for screening for human immunodeficiency virus [HIV]: Secondary | ICD-10-CM

## 2020-09-06 DIAGNOSIS — E559 Vitamin D deficiency, unspecified: Secondary | ICD-10-CM | POA: Diagnosis not present

## 2020-09-06 DIAGNOSIS — F32A Depression, unspecified: Secondary | ICD-10-CM | POA: Diagnosis not present

## 2020-09-06 LAB — CBC WITH DIFFERENTIAL/PLATELET
Basophils Absolute: 0.1 10*3/uL (ref 0.0–0.1)
Basophils Relative: 0.9 % (ref 0.0–3.0)
Eosinophils Absolute: 0.2 10*3/uL (ref 0.0–0.7)
Eosinophils Relative: 1.7 % (ref 0.0–5.0)
HCT: 38.6 % (ref 36.0–46.0)
Hemoglobin: 13 g/dL (ref 12.0–15.0)
Lymphocytes Relative: 35.6 % (ref 12.0–46.0)
Lymphs Abs: 3.2 10*3/uL (ref 0.7–4.0)
MCHC: 33.6 g/dL (ref 30.0–36.0)
MCV: 83.3 fl (ref 78.0–100.0)
Monocytes Absolute: 0.6 10*3/uL (ref 0.1–1.0)
Monocytes Relative: 6.4 % (ref 3.0–12.0)
Neutro Abs: 5 10*3/uL (ref 1.4–7.7)
Neutrophils Relative %: 55.4 % (ref 43.0–77.0)
Platelets: 358 10*3/uL (ref 150.0–400.0)
RBC: 4.64 Mil/uL (ref 3.87–5.11)
RDW: 13.1 % (ref 11.5–15.5)
WBC: 9.1 10*3/uL (ref 4.0–10.5)

## 2020-09-06 LAB — POCT GLYCOSYLATED HEMOGLOBIN (HGB A1C): Hemoglobin A1C: 8.6 % — AB (ref 4.0–5.6)

## 2020-09-06 LAB — VITAMIN D 25 HYDROXY (VIT D DEFICIENCY, FRACTURES): VITD: 19.11 ng/mL — ABNORMAL LOW (ref 30.00–100.00)

## 2020-09-06 LAB — MICROALBUMIN / CREATININE URINE RATIO
Creatinine,U: 55.7 mg/dL
Microalb Creat Ratio: 1.6 mg/g (ref 0.0–30.0)
Microalb, Ur: 0.9 mg/dL (ref 0.0–1.9)

## 2020-09-06 LAB — TSH: TSH: 1.86 u[IU]/mL (ref 0.35–4.50)

## 2020-09-06 MED ORDER — EMPAGLIFLOZIN 25 MG PO TABS: 25.0000 mg | ORAL_TABLET | Freq: Every day | ORAL | 1 refills | Status: DC

## 2020-09-06 NOTE — Progress Notes (Signed)
Patient: Jamie Hughes MRN: [id number] DOB: 10-23-1995 PCP: Orland Mustard, MD     Subjective:  Chief Complaint  Patient presents with  . Annual Exam  . Diabetes  . Vitamin D Deficiency  . depression and anxiety    HPI: The patient is a 25 y.o. female who presents today for annual exam. She denies any changes to past medical history. There have been no recent hospitalizations. She is not following a well balanced diet or exercise plan. She says that she walks 2-3 weekly. Weight has been fluctuating a bit. She says that when she gets depressed she stress eats. Also here to f/u on type 2 diabetes and new anxiety and depression.   No family hx of breast or colon cancer in first degree. Had a maternal aunt with breast cancer.   Diabetes: Patient is here for follow up of type 2 diabetes. First diagnosed 4/20201.  Currently on the following medications metformin 1000mg  BID and rybelsus 7mg /day. Takes medications as prescribed. Last A1C was 9.6 Currently not exercising and following diabetic diet at times. She does emotional eating.  Denies any hypoglycemic events. Denies any vision changes, nausea, vomiting, abdominal pain, ulcers/paraesthesia in feet, polyuria, polydipsia or polyphagia. Denies any chest pain, shortness of breath. She does have constipation with the rybelsus.   Depression/anxiety She had a period of time where she was depressed. Healthy weight and wellness doctor prescribed her zoloft 25mg , but she never started this. She feels like she is coping okay. She is exercising more. She is not in counseling. She may also be moving to [place].  She denies any panic attacks, but then states maybe mild ones. She feels like she has always suffered from social anxiety. No anxiety and depression in her parents, sister has depression possibly anxiety.   Immunization History  Administered Date(s) Administered  . DTaP 09/15/2010  . HPV 9-valent 06/10/2018, 11/05/2018  .  Hpv-Unspecified 11/15/2010  . Moderna Sars-Covid-2 Vaccination 03/09/2020, 04/06/2020  . Varicella 06/30/1996, 09/15/2010   Colonoscopy: routine screening  Mammogram: routine screening  Pap smear: 06/10/2018   Review of Systems  Constitutional: Negative for chills, fatigue and fever.  HENT: Negative for dental problem, ear pain, hearing loss and trouble swallowing.   Eyes: Negative for visual disturbance.  Respiratory: Negative for cough, chest tightness and shortness of breath.   Cardiovascular: Negative for chest pain, palpitations and leg swelling.  Gastrointestinal: Negative for abdominal pain, blood in stool, diarrhea and nausea.  Endocrine: Negative for cold intolerance, polydipsia, polyphagia and polyuria.  Genitourinary: Negative for dysuria and hematuria.  Musculoskeletal: Negative for arthralgias.  Skin: Negative for rash.  Neurological: Negative for dizziness and headaches.  Psychiatric/Behavioral: Negative for dysphoric mood and sleep disturbance. The patient is not nervous/anxious.     Allergies Patient has No Known Allergies.  Past Medical History Patient  has a past medical history of Amenorrhea (2020), Back pain, DM (diabetes mellitus) (HCC), Joint pain, Overweight, Prediabetes, and Vitamin D deficiency.  Surgical History Patient  has a past surgical history that includes Wisdom tooth extraction.  Family History Pateint's family history includes Breast cancer in her maternal aunt; Diabetes in her father, paternal grandfather, and paternal grandmother; Miscarriages / 11/15/2010 in her mother; Obesity in her father.  Social History Patient  reports that she has never smoked. She has never used smokeless tobacco. She reports previous alcohol use. She reports that she does not use drugs.    Objective: Vitals:   09/06/20 1351  BP: 126/84  Pulse: 91  Temp: 98.6 F (37 C)  TempSrc: Temporal  SpO2: 96%  Weight: 223 lb 9.6 oz (101.4 kg)  Height: 5\' 5"  (1.651  m)    Body mass index is 37.21 kg/m.  Physical Exam Vitals reviewed.  Constitutional:      Appearance: Normal appearance. She is well-developed. She is obese.  HENT:     Head: Normocephalic and atraumatic.     Right Ear: Tympanic membrane, ear canal and external ear normal.     Left Ear: Tympanic membrane, ear canal and external ear normal.     Nose: Nose normal.     Mouth/Throat:     Mouth: Mucous membranes are moist.  Eyes:     Extraocular Movements: Extraocular movements intact.     Conjunctiva/sclera: Conjunctivae normal.     Pupils: Pupils are equal, round, and reactive to light.  Neck:     Thyroid: No thyromegaly.  Cardiovascular:     Rate and Rhythm: Normal rate and regular rhythm.     Heart sounds: Normal heart sounds. No murmur heard.   Pulmonary:     Effort: Pulmonary effort is normal.     Breath sounds: Normal breath sounds.  Abdominal:     General: Bowel sounds are normal. There is no distension.     Palpations: Abdomen is soft.     Tenderness: There is no abdominal tenderness.  Musculoskeletal:     Cervical back: Normal range of motion and neck supple.  Lymphadenopathy:     Cervical: No cervical adenopathy.  Skin:    General: Skin is warm and dry.     Capillary Refill: Capillary refill takes less than 2 seconds.     Findings: No rash.     Comments: hirsutism   Neurological:     General: No focal deficit present.     Mental Status: She is alert and oriented to person, place, and time.     Cranial Nerves: No cranial nerve deficit.     Coordination: Coordination normal.     Deep Tendon Reflexes: Reflexes normal.  Psychiatric:        Mood and Affect: Mood normal.        Behavior: Behavior normal.        A1c: 8.6  Flowsheet Row Office Visit from 09/06/2020 in [facility]  PHQ-9 Total Score 11     GAD 7 : Generalized Anxiety Score 09/06/2020  Nervous, Anxious, on Edge 2  Control/stop worrying 2  Worry too much - different  things 3  Trouble relaxing 3  Restless 1  Easily annoyed or irritable 1  Afraid - awful might happen 2  Total GAD 7 Score 14  Anxiety Difficulty Somewhat difficult     Assessment/plan: 1. Annual physical exam Hm reviewed with her. Routine labs today. UTD on HM, needs pneumovax and discussed this today. Really encouraged weight loss and diet. Has to get in control of this as her diabetes continues to be out of control. Want her to make overall changes to better her health. Discussed exercise guidelines. F/u for annual in a year.  Patient counseling [x]    Nutrition: Stressed importance of moderation in sodium/caffeine intake, saturated fat and cholesterol, caloric balance, sufficient intake of fresh fruits, vegetables, fiber, calcium, iron, and 1 mg of folate supplement per day (for females capable of pregnancy).  [x]    Stressed the importance of regular exercise.   [x]    Substance Abuse: Discussed cessation/primary prevention of tobacco, alcohol, or other drug use; driving or other  dangerous activities under the influence; availability of treatment for abuse.   [x]    Injury prevention: Discussed safety belts, safety helmets, smoke detector, smoking near bedding or upholstery.   [x]    Sexuality: Discussed sexually transmitted diseases, partner selection, use of condoms, avoidance of unintended pregnancy  and contraceptive alternatives.  [x]    Dental health: Discussed importance of regular tooth brushing, flossing, and dental visits.  [x]    Health maintenance and immunizations reviewed. Please refer to Health maintenance section.    - CBC with Differential/Platelet - Comprehensive metabolic panel - Lipid panel - TSH  2. Type 2 diabetes mellitus with hyperglycemia, without long-term current use of insulin (HCC) -a1c 8.6.  Improved from last visit at 9.6. still has a ways to go.  -rybelsus added by weight loss doctor but can only afford x 3 months with coupon. Do not see point of increasing  dose if we can not keep her on this medication.  -adding on jardiance and dicussed with her that before her rybelsus runs out needs to call insurance to see if they will cover another GLP-1 drug. If not will likely need insulin to get her a1c controlled. Does not seem to understand magnitude of her disease and risks of uncontrolled diabetes. Discussed again today -offered diabetic education/nutrition, she declined -needs pnumovax and discussed. Will think about.  -needs to really exercise and discussed 150 minutes/week.  -she may be moving to [place], but again stressed importance of close f/u in 3 months.   - POCT glycosylated hemoglobin (Hb A1C) - Microalbumin / creatinine urine ratio - Semaglutide (RYBELSUS) 7 MG TABS; Take 7 mg by mouth daily.  Dispense: 30 tablet; Refill: 0  3. Vitamin D deficiency  - VITAMIN D 25 Hydroxy (Vit-D Deficiency, Fractures)  4. Anxiety and depression -phq 9 score is moderate as is her gad7 score. I think the new diagnosis of her disease as well as her job is a big driving force for this. She also has long hx of social anxiety. discussed that since it's affecting her daily life its time we start daily medication. Already has zoloft written by weight loss doctor. Can start with the 25mg  px and titrate up to 50mg  in 2-3 weeks. Close f/u with me in 1 month.  I've explained to her that drugs of the SSRI class can have side effects such as weight gain, sexual dysfunction, insomnia, headache, nausea. These medications are generally effective at alleviating symptoms of anxiety and/or depression. Let me know if significant side effects do occur.may make anxiety worse in the beginning. Any si/hi she is to call 911 or go to ED. Again encouraged exercise and counseling.    5. Encounter for screening for HIV  - HIV Antibody (routine testing w rflx)     Return in about 1 month (around 10/06/2020) for [facility] for depression/anxiety .     , MD [place] [facility]  09/06/2020

## 2020-09-06 NOTE — Patient Instructions (Signed)
-  adding on a drug called jardiance. I want you to take 1/2 tab for a week then can bump up to a full tab. This is an oral diabetic drug. Take this once/day.   -continue metformin and rybelsus. We could increase rybelsus, but if you can't afford after a few more weeks will need to look at alternatives that are covered. We are getting close to only having insulin left.   -start your zoloft at 25mg /day and can titrate up to 50mg  after 2-3 weeks for anxiety and depression. Any suicide thoughts on medication call 911 or go to ER.   Fu in one month for depression or 3 months in [place] for diabetic f/u.  Good luck with everything!  Dr. 

## 2020-09-07 LAB — COMPREHENSIVE METABOLIC PANEL
ALT: 32 U/L (ref 0–35)
AST: 29 U/L (ref 0–37)
Albumin: 3.9 g/dL (ref 3.5–5.2)
Alkaline Phosphatase: 52 U/L (ref 39–117)
BUN: 7 mg/dL (ref 6–23)
CO2: 19 mEq/L (ref 19–32)
Calcium: 9.5 mg/dL (ref 8.4–10.5)
Chloride: 99 mEq/L (ref 96–112)
Creatinine, Ser: 0.61 mg/dL (ref 0.40–1.20)
GFR: 124.23 mL/min (ref >60.00)
Glucose, Bld: 143 mg/dL — ABNORMAL HIGH (ref 70–99)
Potassium: 4.3 mEq/L (ref 3.5–5.1)
Sodium: 134 mEq/L — ABNORMAL LOW (ref 135–145)
Total Bilirubin: 0.3 mg/dL (ref 0.2–1.2)
Total Protein: 7.6 g/dL (ref 6.0–8.3)

## 2020-09-07 LAB — LIPID PANEL
Cholesterol: 197 mg/dL (ref 0–200)
HDL: 28.2 mg/dL — ABNORMAL LOW (ref >39.00)
Total CHOL/HDL Ratio: 7
Triglycerides: 578 mg/dL — ABNORMAL HIGH (ref 0.0–149.0)

## 2020-09-07 LAB — LDL CHOLESTEROL, DIRECT: Direct LDL: 119 mg/dL

## 2020-09-07 LAB — HIV ANTIBODY (ROUTINE TESTING W REFLEX): HIV 1&2 Ab, 4th Generation: NONREACTIVE

## 2020-09-08 ENCOUNTER — Other Ambulatory Visit: Payer: Self-pay | Admitting: Family Medicine

## 2020-09-08 DIAGNOSIS — E781 Pure hyperglyceridemia: Secondary | ICD-10-CM | POA: Insufficient documentation

## 2020-09-08 MED ORDER — VITAMIN D (ERGOCALCIFEROL) 1.25 MG (50000 UNIT) PO CAPS: ORAL_CAPSULE | ORAL | 0 refills | Status: AC

## 2020-09-08 MED ORDER — ROSUVASTATIN CALCIUM 10 MG PO TABS: 10.0000 mg | ORAL_TABLET | Freq: Every day | ORAL | 3 refills | Status: AC

## 2020-09-27 ENCOUNTER — Other Ambulatory Visit: Payer: Self-pay

## 2020-09-27 ENCOUNTER — Telehealth (INDEPENDENT_AMBULATORY_CARE_PROVIDER_SITE_OTHER): Payer: Self-pay

## 2020-09-27 ENCOUNTER — Ambulatory Visit (INDEPENDENT_AMBULATORY_CARE_PROVIDER_SITE_OTHER): Payer: BLUE CROSS/BLUE SHIELD | Admitting: Family Medicine

## 2020-09-27 ENCOUNTER — Encounter (INDEPENDENT_AMBULATORY_CARE_PROVIDER_SITE_OTHER): Payer: Self-pay | Admitting: Family Medicine

## 2020-09-27 VITALS — BP 128/85 | HR 95 | Temp 98.4°F | Ht 65.0 in | Wt 217.0 lb

## 2020-09-27 DIAGNOSIS — Z6837 Body mass index (BMI) 37.0-37.9, adult: Secondary | ICD-10-CM | POA: Diagnosis not present

## 2020-09-27 DIAGNOSIS — Z9189 Other specified personal risk factors, not elsewhere classified: Secondary | ICD-10-CM | POA: Diagnosis not present

## 2020-09-27 DIAGNOSIS — E559 Vitamin D deficiency, unspecified: Secondary | ICD-10-CM | POA: Diagnosis not present

## 2020-09-27 DIAGNOSIS — E1165 Type 2 diabetes mellitus with hyperglycemia: Secondary | ICD-10-CM

## 2020-09-27 MED ORDER — RYBELSUS 7 MG PO TABS: 7.0000 mg | ORAL_TABLET | Freq: Every day | ORAL | 3 refills | Status: DC

## 2020-09-27 NOTE — Telephone Encounter (Signed)
PA has been initiated and approved for Rybelsus 7mg  tablets via .    Key: [id number]  This request has received a Favorable outcome from [facility].  Please keep in mind this is not a guarantee of payment. Eligibility and Benefit determinations will be made at the time of service.  Please note any additional information provided by [facility] at the bottom of the screen.  Effective from 09/27/2020 through 09/26/2021.

## 2020-09-28 NOTE — Progress Notes (Signed)
Chief Complaint:   OBESITY Jamie Hughes is here to discuss her progress with her obesity treatment plan along with follow-up of her obesity related diagnoses. Jamie Hughes is on the Category 3 Plan and keeping a food journal and adhering to recommended goals of 250-400 calories and 30 g protein and states she is following her eating plan approximately 80% of the time. Jamie Hughes states she is walking more 30-120 minutes 2 times per week.  Today's visit was #: 10 Starting weight: 219 lbs Starting date: 11/19/2019 Today's weight: 217 lbs Today's date: 09/27/2020 Total lbs lost to date: 2 Total lbs lost since last in-office visit: 5  Interim History: Jamie Hughes is doing protein shakes in the morning since she struggles with food in them mornings. She found out she's moving to [place] so this may be her last appointment. She is excited to move. Pt wants to know what comes after category 3. She hasn't started Jardiance yet.  Subjective:   1. Type 2 diabetes mellitus with hyperglycemia, without long-term current use of insulin (HCC) Jamie Hughes is o Rybelsus 7 mg with no GI side effects. Her last A1c was 8.6 (prior to that 9.6). She is on Jardiance, Metformin, and Rybelsus.  2. Vitamin D deficiency Jamie Hughes denies nausea, vomiting, and muscle weakness but notes fatigue. Pt is now on prescription Vit D.  3. At risk for activity intolerance Jamie Hughes is at risk for exercise intolerance due to not exercising consistently.  Assessment/Plan:   1. Type 2 diabetes mellitus with hyperglycemia, without long-term current use of insulin (HCC) Good blood sugar control is important to decrease the likelihood of diabetic complications such as nephropathy, neuropathy, limb loss, blindness, coronary artery disease, and death. Intensive lifestyle modification including diet, exercise and weight loss are the first line of treatment for diabetes.  - Semaglutide (RYBELSUS) 7 MG TABS; Take 7 mg by mouth daily.  Dispense: 30 tablet;  Refill: 3  2. Vitamin D deficiency Low Vitamin D level contributes to fatigue and are associated with obesity, breast, and colon cancer. She agrees to continue to take prescription Vitamin D @50 ,000 IU every week and will follow-up for routine testing of Vitamin D, at least 2-3 times per year to avoid over-replacement.  3. At risk for activity intolerance Jamie Hughes was given approximately 15 minutes of exercise intolerance counseling today. She is 25 y.o. female and has risk factors exercise intolerance including obesity. We discussed intensive lifestyle modifications today with an emphasis on specific weight loss instructions and strategies. Jamie Hughes will slowly increase activity as tolerated.  Repetitive spaced learning was employed today to elicit superior memory formation and behavioral change.  4. Class 2 severe obesity with serious comorbidity and body mass index (BMI) of 37.0 to 37.9 in adult, unspecified obesity type (HCC)  Jamie Hughes is currently in the action stage of change. As such, her goal is to continue with weight loss efforts. She has agreed to keeping a food journal and adhering to recommended goals of 1650-1800 calories and 110+ g protein.   Exercise goals: All adults should avoid inactivity. Some physical activity is better than none, and adults who participate in any amount of physical activity gain some health benefits. As is  Behavioral modification strategies: increasing lean protein intake, meal planning and cooking strategies, keeping healthy foods in the home and planning for success.  Jamie Hughes has agreed to follow-up with our clinic in 2-3 weeks. She was informed of the importance of frequent follow-up visits to maximize her success with intensive lifestyle modifications  for her multiple health conditions.   Objective:   Blood pressure 128/85, pulse 95, temperature 98.4 F (36.9 C), height 5\' 5"  (1.651 m), weight 217 lb (98.4 kg), SpO2 96 %. Body mass index is 36.11  kg/m.  General: Cooperative, alert, well developed, in no acute distress. HEENT: Conjunctivae and lids unremarkable. Cardiovascular: Regular rhythm.  Lungs: Normal work of breathing. Neurologic: No focal deficits.   Lab Results  Component Value Date   CREATININE 0.61 09/06/2020   BUN 7 09/06/2020   NA 134 (L) 09/06/2020   K 4.3 09/06/2020   CL 99 09/06/2020   CO2 19 09/06/2020   Lab Results  Component Value Date   ALT 32 09/06/2020   AST 29 09/06/2020   ALKPHOS 52 09/06/2020   BILITOT 0.3 09/06/2020   Lab Results  Component Value Date   HGBA1C 8.6 (A) 09/06/2020   HGBA1C 9.6 (A) 04/21/2020   HGBA1C 11.9 (H) 09/04/2019   Lab Results  Component Value Date   INSULIN 41.4 (H) 11/19/2019   Lab Results  Component Value Date   TSH 1.86 09/06/2020   Lab Results  Component Value Date   CHOL 197 09/06/2020   HDL 28.20 (L) 09/06/2020   LDLCALC 89 07/10/2018   LDLDIRECT 119.0 09/06/2020   TRIG (H) 09/06/2020    578.0 Triglyceride is over 400; calculations on Lipids are invalid.   CHOLHDL 7 09/06/2020   Lab Results  Component Value Date   WBC 9.1 09/06/2020   HGB 13.0 09/06/2020   HCT 38.6 09/06/2020   MCV 83.3 09/06/2020   PLT 358.0 09/06/2020   No results found for: IRON, TIBC, FERRITIN   Attestation Statements:   Reviewed by clinician on day of visit: allergies, medications, problem list, medical history, surgical history, family history, social history, and previous encounter notes.  09/08/2020, CMA, am acting as transcriptionist for Edmund Hilda, MD.   I have reviewed the above documentation for accuracy and completeness, and I agree with the above. - Reuben Likes, MD

## 2020-10-08 ENCOUNTER — Ambulatory Visit: Payer: BLUE CROSS/BLUE SHIELD | Admitting: Family Medicine

## 2020-10-12 ENCOUNTER — Encounter: Payer: Self-pay | Admitting: Family Medicine

## 2020-10-22 ENCOUNTER — Encounter (INDEPENDENT_AMBULATORY_CARE_PROVIDER_SITE_OTHER): Payer: Self-pay | Admitting: Family Medicine

## 2020-10-22 ENCOUNTER — Other Ambulatory Visit: Payer: Self-pay | Admitting: Family Medicine

## 2020-10-22 DIAGNOSIS — E1165 Type 2 diabetes mellitus with hyperglycemia: Secondary | ICD-10-CM

## 2020-11-03 ENCOUNTER — Other Ambulatory Visit (INDEPENDENT_AMBULATORY_CARE_PROVIDER_SITE_OTHER): Payer: Self-pay | Admitting: Family Medicine

## 2020-11-03 ENCOUNTER — Other Ambulatory Visit: Payer: Self-pay | Admitting: Family Medicine

## 2020-11-03 DIAGNOSIS — E1165 Type 2 diabetes mellitus with hyperglycemia: Secondary | ICD-10-CM

## 2020-11-04 ENCOUNTER — Other Ambulatory Visit (INDEPENDENT_AMBULATORY_CARE_PROVIDER_SITE_OTHER): Payer: Self-pay

## 2020-11-04 ENCOUNTER — Telehealth: Payer: Self-pay

## 2020-11-04 ENCOUNTER — Encounter (INDEPENDENT_AMBULATORY_CARE_PROVIDER_SITE_OTHER): Payer: Self-pay | Admitting: Family Medicine

## 2020-11-04 DIAGNOSIS — E1165 Type 2 diabetes mellitus with hyperglycemia: Secondary | ICD-10-CM

## 2020-11-04 MED ORDER — METFORMIN HCL 1000 MG PO TABS: 1000.0000 mg | ORAL_TABLET | Freq: Two times a day (BID) | ORAL | 0 refills | Status: DC

## 2020-11-04 NOTE — Telephone Encounter (Signed)
Dr.Ukleja 

## 2020-11-04 NOTE — Telephone Encounter (Signed)
Pt called asking for a refill for Metformin. There was a mishap at the pharmacy and her information and prescriptions were erased from the system. She would like her medication to be filled if possible. Please advise.

## 2020-11-04 NOTE — Telephone Encounter (Signed)
Spoke with the patient who states she is still moving but it will be delayed a couples of months and she is completley out of Metformin. Sent in a 90 day supply for Metformin and pt will follow up with [place] on 11/18/2020. Ikenna Ohms, CMA

## 2020-11-08 NOTE — Telephone Encounter (Signed)
Lvm for the pt to call the office. °

## 2020-11-08 NOTE — Telephone Encounter (Signed)
Please disregard message below. I was not able to lvm, pt vm was full.

## 2020-11-09 NOTE — Telephone Encounter (Signed)
90 day refill was sent by [facility].

## 2020-11-17 ENCOUNTER — Encounter (INDEPENDENT_AMBULATORY_CARE_PROVIDER_SITE_OTHER): Payer: Self-pay

## 2020-11-18 ENCOUNTER — Other Ambulatory Visit: Payer: Self-pay

## 2020-11-18 ENCOUNTER — Encounter (INDEPENDENT_AMBULATORY_CARE_PROVIDER_SITE_OTHER): Payer: Self-pay | Admitting: Family Medicine

## 2020-11-18 ENCOUNTER — Ambulatory Visit (INDEPENDENT_AMBULATORY_CARE_PROVIDER_SITE_OTHER): Payer: BLUE CROSS/BLUE SHIELD | Admitting: Family Medicine

## 2020-11-18 VITALS — BP 137/90 | HR 107 | Temp 98.2°F | Ht 65.0 in | Wt 218.0 lb

## 2020-11-18 DIAGNOSIS — Z6836 Body mass index (BMI) 36.0-36.9, adult: Secondary | ICD-10-CM | POA: Diagnosis not present

## 2020-11-18 DIAGNOSIS — Z309 Encounter for contraceptive management, unspecified: Secondary | ICD-10-CM | POA: Diagnosis not present

## 2020-11-18 DIAGNOSIS — E1165 Type 2 diabetes mellitus with hyperglycemia: Secondary | ICD-10-CM

## 2020-11-18 DIAGNOSIS — Z9189 Other specified personal risk factors, not elsewhere classified: Secondary | ICD-10-CM | POA: Diagnosis not present

## 2020-11-18 DIAGNOSIS — E66812 Obesity, class 2: Secondary | ICD-10-CM

## 2020-11-18 MED ORDER — NORETHIN ACE-ETH ESTRAD-FE 1-20 MG-MCG PO TABS: 1.0000 | ORAL_TABLET | Freq: Every day | ORAL | 0 refills | Status: AC

## 2020-11-18 MED ORDER — RYBELSUS 7 MG PO TABS: 7.0000 mg | ORAL_TABLET | Freq: Every day | ORAL | 2 refills | Status: AC

## 2020-11-18 MED ORDER — METFORMIN HCL 1000 MG PO TABS: 1000.0000 mg | ORAL_TABLET | Freq: Two times a day (BID) | ORAL | 0 refills | Status: AC

## 2020-11-23 NOTE — Progress Notes (Signed)
Chief Complaint:   OBESITY Jamie Hughes is here to discuss her progress with her obesity treatment plan along with follow-up of her obesity related diagnoses. Jamie Hughes is on the Category 3 Plan and states she is following her eating plan approximately 80% of the time. Jamie Hughes states she is using resistance bands for 30-60 minutes 3 times per week.  Today's visit was #: 11 Starting weight: 219 lbs Starting date: 11/19/2019 Today's weight: 218 lbs Today's date: 11/18/2020 Total lbs lost to date: 1 lb Total lbs lost since last in-office visit: 0  Interim History: Jamie Hughes has been following Category 3 rather than journaling.  She does not always get in the prescribed protein.  Denies intake of sugar-sweetened beverages.  She is moving to [place] this week.  Subjective:   1. Type 2 diabetes mellitus with hyperglycemia, without long-term current use of insulin (HCC) Last A1c was 8.6 (previously 9.6).  She is on Jardiance and Rybelsus 7 mg.  Not checking CBGs.  Lab Results  Component Value Date   HGBA1C 8.6 (A) 09/06/2020   HGBA1C 9.6 (A) 04/21/2020   HGBA1C 11.9 (H) 09/04/2019   Lab Results  Component Value Date   MICROALBUR 0.9 09/06/2020   LDLCALC 89 07/10/2018   CREATININE 0.61 09/06/2020   Lab Results  Component Value Date   INSULIN 41.4 (H) 11/19/2019   2. Encounter for contraceptive management, unspecified type Has been on birth control pill for 1-2 years.  She is stable on these.  She does not have periods without them.  3. At risk for hyperglycemia Jamie Hughes  is at risk for hyperglycemia due to uncontrolled diabetes.  Assessment/Plan:   1. Type 2 diabetes mellitus with hyperglycemia, without long-term current use of insulin (HCC) Refill Rybelsus 7 mg daily and metformin 1,000 mg twice daily, as per below.  - Refill Semaglutide (RYBELSUS) 7 MG TABS; Take 7 mg by mouth daily.  Dispense: 30 tablet; Refill: 2 - Refill metFORMIN (GLUCOPHAGE) 1000 MG tablet; Take 1 tablet (1,000  mg total) by mouth 2 (two) times daily with a meal. Take 1 tab twice a day for diabetes  Dispense: 180 tablet; Refill: 0  2. Encounter for contraceptive management, unspecified type Refill Loestrin, as per below. Instructed her establish care ASAP with PCP in [place].  - Refill norethindrone-ethinyl estradiol-FE (LOESTRIN FE) 1-20 MG-MCG tablet; Take 1 tablet by mouth daily.  Dispense: 84 tablet; Refill: 0  3. At risk for hyperglycemia Jamie Hughes was given approximately 15 minutes of counseling today regarding prevention of hyperglycemia. She was advised of hyperglycemia causes and the fact hyperglycemia is often asymptomatic. Jamie Hughes was instructed to avoid skipping meals, eat regular protein rich meals and schedule low calorie but protein rich snacks as needed.  She is at risk due to uncontrolled diabetes.  Repetitive spaced learning was employed today to elicit superior memory formation and behavioral change   4. Obesity: Current BMI 36.28  Jamie Hughes is currently in the action stage of change. As such, her goal is to continue with weight loss efforts. She has agreed to the Category 3 Plan.   Set up with PCP in [place] ASAP.  Exercise goals:  Add cardio.  Behavioral modification strategies: increasing lean protein intake and decreasing simple carbohydrates.  Jamie Hughes is moving to [place], so she will not be following up.   Objective:   Blood pressure 137/90, pulse (!) 107, temperature 98.2 F (36.8 C), height 5\' 5"  (1.651 m), weight 218 lb (98.9 kg), SpO2 96 %. Body mass index  is 36.28 kg/m.  General: Cooperative, alert, well developed, in no acute distress. HEENT: Conjunctivae and lids unremarkable. Cardiovascular: Regular rhythm.  Lungs: Normal work of breathing. Neurologic: No focal deficits.   Lab Results  Component Value Date   CREATININE 0.61 09/06/2020   BUN 7 09/06/2020   NA 134 (L) 09/06/2020   K 4.3 09/06/2020   CL 99 09/06/2020   CO2 19 09/06/2020   Lab Results   Component Value Date   ALT 32 09/06/2020   AST 29 09/06/2020   ALKPHOS 52 09/06/2020   BILITOT 0.3 09/06/2020   Lab Results  Component Value Date   HGBA1C 8.6 (A) 09/06/2020   HGBA1C 9.6 (A) 04/21/2020   HGBA1C 11.9 (H) 09/04/2019   Lab Results  Component Value Date   INSULIN 41.4 (H) 11/19/2019   Lab Results  Component Value Date   TSH 1.86 09/06/2020   Lab Results  Component Value Date   CHOL 197 09/06/2020   HDL 28.20 (L) 09/06/2020   LDLCALC 89 07/10/2018   LDLDIRECT 119.0 09/06/2020   TRIG (H) 09/06/2020    578.0 Triglyceride is over 400; calculations on Lipids are invalid.   CHOLHDL 7 09/06/2020   Lab Results  Component Value Date   VD25OH 19.11 (L) 09/06/2020   VD25OH 44 04/21/2020   VD25OH 11.44 (L) 09/04/2019   Lab Results  Component Value Date   WBC 9.1 09/06/2020   HGB 13.0 09/06/2020   HCT 38.6 09/06/2020   MCV 83.3 09/06/2020   PLT 358.0 09/06/2020   Obesity Behavioral Intervention:   Approximately 15 minutes were spent on the discussion below.  ASK: We discussed the diagnosis of obesity with Jamie Hughes today and Jamie Hughes agreed to give [place] permission to discuss obesity behavioral modification therapy today.  ASSESS: Jamie Hughes has the diagnosis of obesity and her BMI today is 36.4. Jamie Hughes is in the action stage of change.   ADVISE: Jamie Hughes was educated on the multiple health risks of obesity as well as the benefit of weight loss to improve her health. She was advised of the need for long term treatment and the importance of lifestyle modifications to improve her current health and to decrease her risk of future health problems.  AGREE: Multiple dietary modification options and treatment options were discussed and Jamie Hughes agreed to follow the recommendations documented in the above note.  ARRANGE: Jamie Hughes was educated on the importance of frequent visits to treat obesity as outlined per CMS and USPSTF guidelines and agreed to schedule her next  follow up appointment today.  Attestation Statements:   Reviewed by clinician on day of visit: allergies, medications, problem list, medical history, surgical history, family history, social history, and previous encounter notes.  I, Insurance claims handler, CMA, am acting as Energy manager for [place], FNP.  I have reviewed the above documentation for accuracy and completeness, and I agree with the above. -  Jesse Sans, FNP

## 2021-01-10 ENCOUNTER — Ambulatory Visit: Payer: BC Managed Care – PPO

## 2021-01-17 DIAGNOSIS — S0003XA Contusion of scalp, initial encounter: Secondary | ICD-10-CM | POA: Diagnosis not present

## 2021-01-17 DIAGNOSIS — E119 Type 2 diabetes mellitus without complications: Secondary | ICD-10-CM | POA: Diagnosis not present

## 2021-01-17 DIAGNOSIS — S20211A Contusion of right front wall of thorax, initial encounter: Secondary | ICD-10-CM | POA: Diagnosis not present

## 2021-12-21 ENCOUNTER — Encounter (INDEPENDENT_AMBULATORY_CARE_PROVIDER_SITE_OTHER): Payer: Self-pay

## 2023-10-31 ENCOUNTER — Telehealth: Payer: Self-pay | Admitting: *Deleted

## 2023-10-31 NOTE — Telephone Encounter (Signed)
 Copied from CRM [id number]. Topic: Clinical - Lab/Test Results >> Oct 29, 2023 10:48 AM Jamie Hughes wrote: Reason for CRM: Patient would like a nurse to give a call to provide previous A1C levels & date of diagnose for diabetes. Please call [phone]

## 2023-10-31 NOTE — Telephone Encounter (Signed)
 This is not are patient, has not been seen at this practice since 2022 and the provider is no longer here.
